# Patient Record
Sex: Female | Born: 1980
Health system: Southern US, Community
[De-identification: ages and names within clinical notes are randomized; demographics above are authoritative.]

## PROBLEM LIST (undated history)

## (undated) DIAGNOSIS — R87629 Unspecified abnormal cytological findings in specimens from vagina: Secondary | ICD-10-CM

## (undated) DIAGNOSIS — K219 Gastro-esophageal reflux disease without esophagitis: Secondary | ICD-10-CM

## (undated) DIAGNOSIS — L719 Rosacea, unspecified: Secondary | ICD-10-CM

## (undated) DIAGNOSIS — G4730 Sleep apnea, unspecified: Secondary | ICD-10-CM

## (undated) DIAGNOSIS — E785 Hyperlipidemia, unspecified: Secondary | ICD-10-CM

## (undated) DIAGNOSIS — O09529 Supervision of elderly multigravida, unspecified trimester: Secondary | ICD-10-CM

## (undated) HISTORY — DX: Rosacea, unspecified: L71.9

## (undated) HISTORY — DX: Hyperlipidemia, unspecified: E78.5

## (undated) HISTORY — DX: Gastro-esophageal reflux disease without esophagitis: K21.9

## (undated) HISTORY — PX: OTHER SURGICAL HISTORY: SHX169

## (undated) HISTORY — PX: WISDOM TOOTH EXTRACTION: SHX21

## (undated) HISTORY — DX: Unspecified abnormal cytological findings in specimens from vagina: R87.629

## (undated) HISTORY — PX: COLPOSCOPY: SHX161

---

## 2016-03-23 DIAGNOSIS — Z713 Dietary counseling and surveillance: Secondary | ICD-10-CM | POA: Diagnosis not present

## 2016-04-11 DIAGNOSIS — K080 Exfoliation of teeth due to systemic causes: Secondary | ICD-10-CM | POA: Diagnosis not present

## 2016-05-24 DIAGNOSIS — Z01419 Encounter for gynecological examination (general) (routine) without abnormal findings: Secondary | ICD-10-CM | POA: Diagnosis not present

## 2016-05-24 DIAGNOSIS — Z6827 Body mass index (BMI) 27.0-27.9, adult: Secondary | ICD-10-CM | POA: Diagnosis not present

## 2016-05-24 DIAGNOSIS — Z1151 Encounter for screening for human papillomavirus (HPV): Secondary | ICD-10-CM | POA: Diagnosis not present

## 2016-06-20 DIAGNOSIS — Z3200 Encounter for pregnancy test, result unknown: Secondary | ICD-10-CM | POA: Diagnosis not present

## 2016-06-20 DIAGNOSIS — B977 Papillomavirus as the cause of diseases classified elsewhere: Secondary | ICD-10-CM | POA: Diagnosis not present

## 2016-07-12 DIAGNOSIS — Z3201 Encounter for pregnancy test, result positive: Secondary | ICD-10-CM | POA: Diagnosis not present

## 2016-07-17 NOTE — L&D Delivery Note (Signed)
Cesarean Section Procedure Note   Samantha CaoStephanie Strohmeier  03/17/2017 - 03/18/2017  Indications: arrest of descent   Pre-operative Diagnosis: iup at 39.6, prolonged rom, arrest of descent  Procedure: primary low transverse c-section Post-operative Diagnosis: Same   Surgeon: Surgeon(s) and Role:    * Allyson Tineo, Candace GallusSusan E, MD - Primary   Assistants: Arlan Organaniela Paul, CNM  Anesthesia: epidural   Procedure Details:  The patient was found to have arrest of descent and plan made for c-secion. The risks, benefits, complications, treatment options, and expected outcomes were discussed with the patient. The patient concurred with the proposed plan, giving informed consent. identified as Samantha CaoStephanie Hilliker and the procedure verified as C-Section Delivery. A Time Out was held and the above information confirmed.  After anesthesia was dosed for procedure, the patient was draped and prepped in the usual sterile manner. A transverse incision was made and carried down through the subcutaneous tissue to the fascia. Fascial incision was made and extended transversely with the mayo scissors. The fascia was separated from the underlying rectus tissue superiorly and inferiorly bluntly and with mayo scissors. The peritoneum was identified and entered bluntly. Peritoneal incision was extended longitudinally. The bladder blade was then placed and the utero-vesical peritoneal reflection was incised transversely and the bladder flap was bluntly freed from the lower uterine segment.  Uterine incision was then made and fetal head was then flexed and gently advanced toward the incision.  This was done without difficulty and the head was then delivered.  The right shoulder was then delivered followed quickly by the abdomen.  The infant was then bulb suctioned and then the cord was clamped and cut. The placenta was removed Intact and manually.  Placenta appeared normal.   The uterus was exteriorized and cleared of clot and debris.  The uterine  outline, tubes and ovaries appeared normal}. The uterine incision was closed with running locked sutures of   0 monocryl.   Hemostasis was observed.  An embrocating layer was then performed with 0 vicryl. Lavage was carried out until clear. The fascia was then reapproximated with running sutures of 0Vicryl. The subcuticular closure was performed using 3-0Vicryl. The skin was closed with 4-0Vicryl.   Instrument, sponge, and needle counts were correct prior the abdominal closure and were correct at the conclusion of the case.    Findings: viable female infant; apgars 9/9, normal uterus; nml tubs/ovaries wnl   Estimated Blood Loss: 830ml  Total IV Fluids: 2400ml   Urine Output: 150ml  Specimens: none   Complications: no complications  Disposition: PACU - hemodynamically stable.   Maternal Condition: stable   Baby condition / location:  Couplet care / Skin to Skin  Attending Attestation: I was present and scrubbed for the entire procedure.   Signed: Surgeon(s): Amado NashAlmquist, Candace GallusSusan E, MD

## 2016-07-28 DIAGNOSIS — Z3201 Encounter for pregnancy test, result positive: Secondary | ICD-10-CM | POA: Diagnosis not present

## 2016-08-21 DIAGNOSIS — Z3491 Encounter for supervision of normal pregnancy, unspecified, first trimester: Secondary | ICD-10-CM | POA: Diagnosis not present

## 2016-08-21 DIAGNOSIS — Z3689 Encounter for other specified antenatal screening: Secondary | ICD-10-CM | POA: Diagnosis not present

## 2016-08-21 DIAGNOSIS — Z3A10 10 weeks gestation of pregnancy: Secondary | ICD-10-CM | POA: Diagnosis not present

## 2016-08-21 DIAGNOSIS — Z36 Encounter for antenatal screening for chromosomal anomalies: Secondary | ICD-10-CM | POA: Diagnosis not present

## 2016-08-21 DIAGNOSIS — O09511 Supervision of elderly primigravida, first trimester: Secondary | ICD-10-CM | POA: Diagnosis not present

## 2016-08-21 LAB — OB RESULTS CONSOLE GC/CHLAMYDIA
Chlamydia: NEGATIVE
Gonorrhea: NEGATIVE

## 2016-08-21 LAB — OB RESULTS CONSOLE RUBELLA ANTIBODY, IGM: Rubella: IMMUNE

## 2016-08-21 LAB — OB RESULTS CONSOLE ABO/RH: RH Type: POSITIVE

## 2016-08-21 LAB — OB RESULTS CONSOLE ANTIBODY SCREEN: ANTIBODY SCREEN: NEGATIVE

## 2016-08-21 LAB — OB RESULTS CONSOLE HIV ANTIBODY (ROUTINE TESTING): HIV: NONREACTIVE

## 2016-08-21 LAB — OB RESULTS CONSOLE HEPATITIS B SURFACE ANTIGEN: HEP B S AG: NEGATIVE

## 2016-08-21 LAB — OB RESULTS CONSOLE RPR: RPR: NONREACTIVE

## 2016-09-06 DIAGNOSIS — Z3682 Encounter for antenatal screening for nuchal translucency: Secondary | ICD-10-CM | POA: Diagnosis not present

## 2016-10-02 DIAGNOSIS — Z3A16 16 weeks gestation of pregnancy: Secondary | ICD-10-CM | POA: Diagnosis not present

## 2016-10-02 DIAGNOSIS — O09512 Supervision of elderly primigravida, second trimester: Secondary | ICD-10-CM | POA: Diagnosis not present

## 2016-10-02 DIAGNOSIS — Z361 Encounter for antenatal screening for raised alphafetoprotein level: Secondary | ICD-10-CM | POA: Diagnosis not present

## 2016-10-23 DIAGNOSIS — Z3A19 19 weeks gestation of pregnancy: Secondary | ICD-10-CM | POA: Diagnosis not present

## 2016-10-23 DIAGNOSIS — Z363 Encounter for antenatal screening for malformations: Secondary | ICD-10-CM | POA: Diagnosis not present

## 2016-10-23 DIAGNOSIS — O09512 Supervision of elderly primigravida, second trimester: Secondary | ICD-10-CM | POA: Diagnosis not present

## 2016-11-08 DIAGNOSIS — Z3A21 21 weeks gestation of pregnancy: Secondary | ICD-10-CM | POA: Diagnosis not present

## 2016-11-08 DIAGNOSIS — O09512 Supervision of elderly primigravida, second trimester: Secondary | ICD-10-CM | POA: Diagnosis not present

## 2016-12-05 DIAGNOSIS — O09512 Supervision of elderly primigravida, second trimester: Secondary | ICD-10-CM | POA: Diagnosis not present

## 2016-12-05 DIAGNOSIS — Z3A25 25 weeks gestation of pregnancy: Secondary | ICD-10-CM | POA: Diagnosis not present

## 2016-12-13 DIAGNOSIS — K080 Exfoliation of teeth due to systemic causes: Secondary | ICD-10-CM | POA: Diagnosis not present

## 2016-12-28 DIAGNOSIS — O4403 Placenta previa specified as without hemorrhage, third trimester: Secondary | ICD-10-CM | POA: Diagnosis not present

## 2016-12-28 DIAGNOSIS — Z23 Encounter for immunization: Secondary | ICD-10-CM | POA: Diagnosis not present

## 2016-12-28 DIAGNOSIS — Z3689 Encounter for other specified antenatal screening: Secondary | ICD-10-CM | POA: Diagnosis not present

## 2016-12-28 DIAGNOSIS — Z3A28 28 weeks gestation of pregnancy: Secondary | ICD-10-CM | POA: Diagnosis not present

## 2017-01-10 DIAGNOSIS — Z3A30 30 weeks gestation of pregnancy: Secondary | ICD-10-CM | POA: Diagnosis not present

## 2017-01-10 DIAGNOSIS — O4403 Placenta previa specified as without hemorrhage, third trimester: Secondary | ICD-10-CM | POA: Diagnosis not present

## 2017-01-26 ENCOUNTER — Ambulatory Visit (INDEPENDENT_AMBULATORY_CARE_PROVIDER_SITE_OTHER): Payer: Self-pay | Admitting: Pediatrics

## 2017-01-26 DIAGNOSIS — Z7681 Expectant parent(s) prebirth pediatrician visit: Secondary | ICD-10-CM

## 2017-01-30 DIAGNOSIS — Z3686 Encounter for antenatal screening for cervical length: Secondary | ICD-10-CM | POA: Diagnosis not present

## 2017-01-30 DIAGNOSIS — O4443 Low lying placenta NOS or without hemorrhage, third trimester: Secondary | ICD-10-CM | POA: Diagnosis not present

## 2017-01-30 DIAGNOSIS — Z3A33 33 weeks gestation of pregnancy: Secondary | ICD-10-CM | POA: Diagnosis not present

## 2017-01-30 NOTE — Progress Notes (Signed)
Prenatal counseling for impending newborn done-- 1st child, currently 32wks, placental previa monitoring, good prenatal care Z76.81

## 2017-02-14 DIAGNOSIS — Z3685 Encounter for antenatal screening for Streptococcus B: Secondary | ICD-10-CM | POA: Diagnosis not present

## 2017-02-14 DIAGNOSIS — Z3A35 35 weeks gestation of pregnancy: Secondary | ICD-10-CM | POA: Diagnosis not present

## 2017-02-14 DIAGNOSIS — O4443 Low lying placenta NOS or without hemorrhage, third trimester: Secondary | ICD-10-CM | POA: Diagnosis not present

## 2017-02-14 LAB — OB RESULTS CONSOLE GBS: GBS: NEGATIVE

## 2017-02-20 DIAGNOSIS — Z3A36 36 weeks gestation of pregnancy: Secondary | ICD-10-CM | POA: Diagnosis not present

## 2017-02-20 DIAGNOSIS — Z3686 Encounter for antenatal screening for cervical length: Secondary | ICD-10-CM | POA: Diagnosis not present

## 2017-02-20 DIAGNOSIS — O4443 Low lying placenta NOS or without hemorrhage, third trimester: Secondary | ICD-10-CM | POA: Diagnosis not present

## 2017-02-20 DIAGNOSIS — O09513 Supervision of elderly primigravida, third trimester: Secondary | ICD-10-CM | POA: Diagnosis not present

## 2017-02-27 DIAGNOSIS — O09513 Supervision of elderly primigravida, third trimester: Secondary | ICD-10-CM | POA: Diagnosis not present

## 2017-02-27 DIAGNOSIS — Z3A36 36 weeks gestation of pregnancy: Secondary | ICD-10-CM | POA: Diagnosis not present

## 2017-03-05 DIAGNOSIS — Z3A38 38 weeks gestation of pregnancy: Secondary | ICD-10-CM | POA: Diagnosis not present

## 2017-03-05 DIAGNOSIS — O09513 Supervision of elderly primigravida, third trimester: Secondary | ICD-10-CM | POA: Diagnosis not present

## 2017-03-05 DIAGNOSIS — Z3686 Encounter for antenatal screening for cervical length: Secondary | ICD-10-CM | POA: Diagnosis not present

## 2017-03-12 ENCOUNTER — Other Ambulatory Visit: Payer: Self-pay | Admitting: Obstetrics

## 2017-03-12 DIAGNOSIS — Z3A39 39 weeks gestation of pregnancy: Secondary | ICD-10-CM | POA: Diagnosis not present

## 2017-03-12 DIAGNOSIS — O09513 Supervision of elderly primigravida, third trimester: Secondary | ICD-10-CM | POA: Diagnosis not present

## 2017-03-13 ENCOUNTER — Telehealth (HOSPITAL_COMMUNITY): Payer: Self-pay | Admitting: *Deleted

## 2017-03-13 ENCOUNTER — Encounter (HOSPITAL_COMMUNITY): Payer: Self-pay | Admitting: *Deleted

## 2017-03-13 NOTE — Telephone Encounter (Signed)
Preadmission screen  

## 2017-03-17 ENCOUNTER — Encounter (HOSPITAL_COMMUNITY): Payer: Self-pay | Admitting: *Deleted

## 2017-03-17 ENCOUNTER — Inpatient Hospital Stay (HOSPITAL_COMMUNITY)
Admission: AD | Admit: 2017-03-17 | Discharge: 2017-03-20 | DRG: 765 | Disposition: A | Discharge status: Home / Self Care | Payer: Federal, State, Local not specified - PPO | Source: Ambulatory Visit | Attending: Obstetrics | Admitting: Obstetrics

## 2017-03-17 ENCOUNTER — Inpatient Hospital Stay (HOSPITAL_COMMUNITY): Payer: Federal, State, Local not specified - PPO | Admitting: Anesthesiology

## 2017-03-17 DIAGNOSIS — O9081 Anemia of the puerperium: Secondary | ICD-10-CM | POA: Diagnosis not present

## 2017-03-17 DIAGNOSIS — O324XX0 Maternal care for high head at term, not applicable or unspecified: Principal | ICD-10-CM | POA: Diagnosis present

## 2017-03-17 DIAGNOSIS — O9962 Diseases of the digestive system complicating childbirth: Secondary | ICD-10-CM | POA: Diagnosis not present

## 2017-03-17 DIAGNOSIS — O3663X0 Maternal care for excessive fetal growth, third trimester, not applicable or unspecified: Secondary | ICD-10-CM | POA: Diagnosis present

## 2017-03-17 DIAGNOSIS — Z3A39 39 weeks gestation of pregnancy: Secondary | ICD-10-CM

## 2017-03-17 DIAGNOSIS — D62 Acute posthemorrhagic anemia: Secondary | ICD-10-CM | POA: Diagnosis not present

## 2017-03-17 DIAGNOSIS — Z3490 Encounter for supervision of normal pregnancy, unspecified, unspecified trimester: Secondary | ICD-10-CM | POA: Diagnosis present

## 2017-03-17 DIAGNOSIS — Z3A38 38 weeks gestation of pregnancy: Secondary | ICD-10-CM | POA: Diagnosis not present

## 2017-03-17 DIAGNOSIS — O99019 Anemia complicating pregnancy, unspecified trimester: Secondary | ICD-10-CM | POA: Diagnosis not present

## 2017-03-17 DIAGNOSIS — Z3A00 Weeks of gestation of pregnancy not specified: Secondary | ICD-10-CM | POA: Diagnosis not present

## 2017-03-17 DIAGNOSIS — K219 Gastro-esophageal reflux disease without esophagitis: Secondary | ICD-10-CM | POA: Diagnosis present

## 2017-03-17 DIAGNOSIS — O26893 Other specified pregnancy related conditions, third trimester: Secondary | ICD-10-CM | POA: Diagnosis not present

## 2017-03-17 HISTORY — DX: Sleep apnea, unspecified: G47.30

## 2017-03-17 LAB — CBC
HCT: 33.8 % — ABNORMAL LOW (ref 36.0–46.0)
HEMATOCRIT: 30.8 % — AB (ref 36.0–46.0)
HEMOGLOBIN: 12.1 g/dL (ref 12.0–15.0)
Hemoglobin: 11.1 g/dL — ABNORMAL LOW (ref 12.0–15.0)
MCH: 31.8 pg (ref 26.0–34.0)
MCH: 32.2 pg (ref 26.0–34.0)
MCHC: 35.8 g/dL (ref 30.0–36.0)
MCHC: 36 g/dL (ref 30.0–36.0)
MCV: 88.9 fL (ref 78.0–100.0)
MCV: 89.3 fL (ref 78.0–100.0)
PLATELETS: 202 10*3/uL (ref 150–400)
Platelets: 213 10*3/uL (ref 150–400)
RBC: 3.8 MIL/uL — ABNORMAL LOW (ref 3.87–5.11)
RBC: 3.45 MIL/uL — ABNORMAL LOW (ref 3.87–5.11)
RDW: 13.5 % (ref 11.5–15.5)
RDW: 13.4 % (ref 11.5–15.5)
WBC: 8.2 10*3/uL (ref 4.0–10.5)
WBC: 9.2 10*3/uL (ref 4.0–10.5)

## 2017-03-17 LAB — COMPREHENSIVE METABOLIC PANEL
ALBUMIN: 2.9 g/dL — AB (ref 3.5–5.0)
ALK PHOS: 195 U/L — AB (ref 38–126)
ALT: 12 U/L — ABNORMAL LOW (ref 14–54)
ANION GAP: 9 (ref 5–15)
AST: 21 U/L (ref 15–41)
BUN: 12 mg/dL (ref 6–20)
CO2: 20 mmol/L — AB (ref 22–32)
Calcium: 9.1 mg/dL (ref 8.9–10.3)
Chloride: 105 mmol/L (ref 101–111)
Creatinine, Ser: 0.65 mg/dL (ref 0.44–1.00)
GFR calc Af Amer: >60 mL/min (ref >60)
GFR calc non Af Amer: >60 mL/min (ref >60)
GLUCOSE: 115 mg/dL — AB (ref 65–99)
POTASSIUM: 4 mmol/L (ref 3.5–5.1)
SODIUM: 134 mmol/L — AB (ref 135–145)
Total Bilirubin: 0.4 mg/dL (ref 0.3–1.2)
Total Protein: 6.7 g/dL (ref 6.5–8.1)

## 2017-03-17 LAB — PROTEIN / CREATININE RATIO, URINE
Creatinine, Urine: 310 mg/dL
PROTEIN CREATININE RATIO: 0.08 mg/mg{creat} (ref 0.00–0.15)
Total Protein, Urine: 25 mg/dL

## 2017-03-17 LAB — AMNISURE RUPTURE OF MEMBRANE (ROM) NOT AT ARMC: AMNISURE: POSITIVE

## 2017-03-17 LAB — ABO/RH: ABO/RH(D): O POS

## 2017-03-17 LAB — TYPE AND SCREEN
ABO/RH(D): O POS
ANTIBODY SCREEN: NEGATIVE

## 2017-03-17 MED ORDER — SOD CITRATE-CITRIC ACID 500-334 MG/5ML PO SOLN: 30.0000 mL | ORAL | Status: DC | PRN

## 2017-03-17 MED ORDER — OXYCODONE-ACETAMINOPHEN 5-325 MG PO TABS: 1.0000 | ORAL_TABLET | ORAL | Status: DC | PRN

## 2017-03-17 MED ORDER — LACTATED RINGERS IV SOLN: INTRAVENOUS | Status: DC

## 2017-03-17 MED ORDER — FLEET ENEMA 7-19 GM/118ML RE ENEM: 1.0000 | ENEMA | RECTAL | Status: DC | PRN

## 2017-03-17 MED ORDER — MISOPROSTOL 25 MCG QUARTER TABLET: 25.0000 ug | ORAL_TABLET | ORAL | Status: DC | PRN

## 2017-03-17 MED ORDER — ACETAMINOPHEN 325 MG PO TABS: 650.0000 mg | ORAL_TABLET | ORAL | Status: DC | PRN

## 2017-03-17 MED ORDER — OXYCODONE-ACETAMINOPHEN 5-325 MG PO TABS: 2.0000 | ORAL_TABLET | ORAL | Status: DC | PRN

## 2017-03-17 MED ORDER — LIDOCAINE HCL (PF) 1 % IJ SOLN
30.0000 mL | INTRAMUSCULAR | Status: DC | PRN
  Filled 2017-03-17: qty 30

## 2017-03-17 MED ORDER — TERBUTALINE SULFATE 1 MG/ML IJ SOLN: 0.2500 mg | Freq: Once | INTRAMUSCULAR | Status: DC | PRN

## 2017-03-17 MED ORDER — OXYTOCIN 40 UNITS IN LACTATED RINGERS INFUSION - SIMPLE MED
2.5000 [IU]/h | INTRAVENOUS | Status: DC
  Filled 2017-03-17 (×2): qty 1000

## 2017-03-17 MED ORDER — EPHEDRINE 5 MG/ML INJ: 10.0000 mg | INTRAVENOUS | Status: DC | PRN

## 2017-03-17 MED ORDER — LACTATED RINGERS IV SOLN: 500.0000 mL | INTRAVENOUS | Status: DC | PRN

## 2017-03-17 MED ORDER — PHENYLEPHRINE 40 MCG/ML (10ML) SYRINGE FOR IV PUSH (FOR BLOOD PRESSURE SUPPORT): 80.0000 ug | PREFILLED_SYRINGE | INTRAVENOUS | Status: DC | PRN

## 2017-03-17 MED ORDER — LACTATED RINGERS IV SOLN
INTRAVENOUS | Status: DC
  Administered 2017-03-17 (×2): via INTRAVENOUS

## 2017-03-17 MED ORDER — OXYTOCIN 40 UNITS IN LACTATED RINGERS INFUSION - SIMPLE MED
1.0000 m[IU]/min | INTRAVENOUS | Status: DC
  Administered 2017-03-17: 12 m[IU]/min via INTRAVENOUS
  Administered 2017-03-17: 10 m[IU]/min via INTRAVENOUS
  Administered 2017-03-17: 2 m[IU]/min via INTRAVENOUS
  Administered 2017-03-17: 14 m[IU]/min via INTRAVENOUS

## 2017-03-17 MED ORDER — LIDOCAINE HCL (PF) 1 % IJ SOLN
INTRAMUSCULAR | Status: DC | PRN
  Administered 2017-03-17 (×2): 5 mL via EPIDURAL

## 2017-03-17 MED ORDER — OXYTOCIN BOLUS FROM INFUSION: 500.0000 mL | Freq: Once | INTRAVENOUS | Status: DC

## 2017-03-17 MED ORDER — DIPHENHYDRAMINE HCL 50 MG/ML IJ SOLN: 12.5000 mg | INTRAMUSCULAR | Status: DC | PRN

## 2017-03-17 MED ORDER — PHENYLEPHRINE 40 MCG/ML (10ML) SYRINGE FOR IV PUSH (FOR BLOOD PRESSURE SUPPORT)
80.0000 ug | PREFILLED_SYRINGE | INTRAVENOUS | Status: DC | PRN
  Filled 2017-03-17: qty 10

## 2017-03-17 MED ORDER — SOD CITRATE-CITRIC ACID 500-334 MG/5ML PO SOLN
30.0000 mL | ORAL | Status: DC | PRN
  Administered 2017-03-17 – 2017-03-18 (×4): 30 mL via ORAL
  Filled 2017-03-17 (×4): qty 15

## 2017-03-17 MED ORDER — LACTATED RINGERS IV SOLN
500.0000 mL | Freq: Once | INTRAVENOUS | Status: AC
  Administered 2017-03-17: 500 mL via INTRAVENOUS

## 2017-03-17 MED ORDER — FENTANYL 2.5 MCG/ML BUPIVACAINE 1/10 % EPIDURAL INFUSION (WH - ANES)
14.0000 mL/h | INTRAMUSCULAR | Status: DC | PRN
  Administered 2017-03-17 (×2): 14 mL/h via EPIDURAL
  Filled 2017-03-17 (×2): qty 100

## 2017-03-17 MED ORDER — ONDANSETRON HCL 4 MG/2ML IJ SOLN: 4.0000 mg | Freq: Four times a day (QID) | INTRAMUSCULAR | Status: DC | PRN

## 2017-03-17 MED ORDER — OXYTOCIN 40 UNITS IN LACTATED RINGERS INFUSION - SIMPLE MED: 1.0000 m[IU]/min | INTRAVENOUS | Status: DC

## 2017-03-17 NOTE — H&P (Signed)
Samantha CaoStephanie Schmidt is a 36 y.o. female G1P0 at 6039.5 by 6 wk u/s presenting for rom this am around 0800.  She says that there was no gush, but has been leaking clear fluid.  Had some mild contractions overnight and feels that more frequent and regular over last hour or so.  Denies any vaginal bleeding.  +FM.  Antepartum course complicated with AMA, nml genetic screening.  Also complicated with GERD and controlled with Zantac.  Course also complicated by placenta previa that resolved and has been stable at 2.2cm away from cervical os, anterior.  Last u/s was 2 wks ago with efw 7lb 8 oz (efw 66%), nml afi.  HR hpv pap x2 yrs, colpo neg 12/17,  repeat pap pp planned. Pt had mild range bps on admission - labs done and normal. Pt denies h/a, vision changes, RUQ pain. No h/o elevated bp.   [facility] at [facility] since 10 wks History OB History    Gravida Para Term Preterm AB Living   1             SAB TAB Ectopic Multiple Live Births                 Past Medical History:  Diagnosis Date  . GERD (gastroesophageal reflux disease)   . Rosacea   . Sleep apnea   . Sleep apnea   . Vaginal Pap smear, abnormal    Past Surgical History:  Procedure Laterality Date  . arm surgery    . COLPOSCOPY    . WISDOM TOOTH EXTRACTION     Family History: family history includes Hyperlipidemia in her father and mother; Hypertension in her father and mother. Social History:  reports that she has never smoked. She has never used smokeless tobacco. She reports that she does not drink alcohol or use drugs.    ROS  See above, otherwise neg  Dilation: 3 Effacement (%): 80 Station: -3 Exam by:: Dr. Amado NashAlmquist Blood pressure (!) 146/91, pulse 98, temperature 97.9 F (36.6 C), temperature source Oral, resp. rate 18, height 5\' 9"  (1.753 m), weight 213 lb (96.6 kg). Exam Physical Exam  Prenatal labs: ABO, Rh: --/--/O POS (09/01 1158) Antibody: NEG (09/01 1158) Rubella: !Error!immune RPR: Nonreactive (02/05 0000)   HBsAg: Negative (02/05 0000)  HIV: Non-reactive (02/05 0000)  GBS: Negative (08/01 0000)  1 hr Glucola - passed Genetic screening - nml NT/ultrasound Anatomy US: normal anatomy; placenta previa, now 2.2cm away from os, anterior  Physical Exam:  A&O x 3 HEENT - wnl Lungs - ctab CV - rrr Abdo - soft, nt, gravid; efw 7.5-8lbs Extr : no edema, nt bilat LE Pelvic : 3/80/-3; clear fluid noted during exam; cephalic  FHT: 120s, nml variability, +accels, one early decel with subsequent accels TOCO: irregular q 5  CBC    Component Value Date/Time   WBC 8.2 03/17/2017 1158   RBC 3.80 (L) 03/17/2017 1158   HGB 12.1 03/17/2017 1158   HCT 33.8 (L) 03/17/2017 1158   PLT 213 03/17/2017 1158   MCV 88.9 03/17/2017 1158   MCH 31.8 03/17/2017 1158   MCHC 35.8 03/17/2017 1158   RDW 13.5 03/17/2017 1158   CMP Latest Ref Rng & Units 03/17/2017  Glucose 65 - 99 mg/dL 161(W115(H)  BUN 6 - 20 mg/dL 12  Creatinine 9.600.44 - 4.541.00 mg/dL 0.980.65  Sodium 119135 - 147145 mmol/L 134(L)  Potassium 3.5 - 5.1 mmol/L 4.0  Chloride 101 - 111 mmol/L 105  CO2 22 - 32 mmol/L  20(L)  Calcium 8.9 - 10.3 mg/dL 9.1  Total Protein 6.5 - 8.1 g/dL 6.7  Total Bilirubin 0.3 - 1.2 mg/dL 0.4  Alkaline Phos 38 - 126 U/L 195(H)  AST 15 - 41 U/L 21  ALT 14 - 54 U/L 12(L)    Assessment/Plan: G1 at 39.5 1.  SROM - admit for delivery; will obs for 2 hours and re-evaluate for cervical change - if no change, begin pitocin augmentation; I have reviewed this with pt and husband and agree with plan.  Plan svd 2. Fetal status reassuring - continue efm 3. gbs neg 4. GHTN - labs wnl and pt asymptomatic; pt does plan epidural  5. ama - nml genetic testing 6. Rh pos, rubella  Immune 7. H/o placenta previa - now anterior and 2.2cm from os  Vick Frees 03/17/2017, 2:26 PM

## 2017-03-17 NOTE — Anesthesia Pain Management Evaluation Note (Signed)
  CRNA Pain Management Visit Note  Patient: Samantha Schmidt, 36 y.o., female  "Hello I am a member of the anesthesia team at [facility]. We have an anesthesia team available at all times to provide care throughout the hospital, including epidural management and anesthesia for C-section. I don't know your plan for the delivery whether it a natural birth, water birth, IV sedation, nitrous supplementation, doula or epidural, but we want to meet your pain goals."   1.Was your pain managed to your expectations on prior hospitalizations?   No previous hospitalizations.   2.What is your expectation for pain management during this hospitalization?     Epidural  3.How can we help you reach that goal? Standby  Record the patient's initial score and the patient's pain goal.   Pain: 3  Pain Goal: 7 [facility] wants you to be able to say your pain was always managed very well.  Samantha Schmidt 03/17/2017

## 2017-03-17 NOTE — Anesthesia Procedure Notes (Signed)
Epidural

## 2017-03-17 NOTE — Progress Notes (Signed)
S/p epidural, feeling occ ctx in back - about a 3/10  Patient Vitals for the past 24 hrs:  BP Temp Temp src Pulse Resp SpO2 Height Weight  03/17/17 1930 139/85 - - 95 18 - - -  03/17/17 1915 135/82 97.9 F (36.6 C) Oral 91 18 - - -  03/17/17 1910 (!) 147/86 - - 86 18 - - -  03/17/17 1905 136/89 - - 85 18 - - -  03/17/17 1900 (!) 144/85 - - 87 18 - - -  03/17/17 1855 (!) 144/85 - - 93 18 - - -  03/17/17 1852 (!) 156/108 - - 90 18 - - -  03/17/17 1847 (!) 134/93 - - (!) 104 18 - - -  03/17/17 1845 (!) 149/94 - - 92 18 100 % - -  03/17/17 1830 131/79 - - 88 18 - - -  03/17/17 1800 (!) 151/101 - - 94 18 - - -  03/17/17 1743 - 98.2 F (36.8 C) Oral - - - - -  03/17/17 1736 (!) 144/84 - - 91 18 - - -  03/17/17 1700 137/79 - - 95 18 - - -  03/17/17 1630 (!) 137/93 - - 82 18 - - -  03/17/17 1625 (!) 147/104 98 F (36.7 C) Oral 80 18 - - -  03/17/17 1537 (!) 143/97 - - 95 18 - - -  03/17/17 1434 (!) 128/99 98.1 F (36.7 C) Oral (!) 105 18 - - -  03/17/17 1330 (!) 146/91 - - 98 18 - - -  03/17/17 1309 (!) 146/102 97.9 F (36.6 C) Oral 98 20 - 5\' 9"  (1.753 m) 213 lb (96.6 kg)  03/17/17 1246 125/90 - - (!) 102 - - - -  03/17/17 1231 (!) 135/93 - - 96 - - - -  03/17/17 1215 123/89 - - (!) 114 - - - -  03/17/17 1210 130/90 - - (!) 103 - - - -  03/17/17 1146 121/82 - - (!) 107 - - - -  03/17/17 1135 134/82 - - 98 - - - -  03/17/17 1125 (!) 145/98 97.7 F (36.5 C) Oral (!) 113 20 - - -    A&ox3 nml respirations Abd: soft, nt, gravid Cx: 6/95/-2, cephalic LE: +1edema, nt bilat   FHT: 120s, nml variability, +accels, no decels Toco: q 2-3  A/P: iup at 39.5 1. srom - continue pitocin augmentation; plan svd; srom 13 hours ago 2. Fetal status reassuring 3.  Rh pos 4. gbs neg

## 2017-03-17 NOTE — MAU Note (Signed)
Pt started ? Leaking last night, had more leaking this a.m., clear mucus, pink @ times.  Leaking down her leg right now.  Also having cramping & pelvic pressure.  Denies bleeding, reports good fetal movement.

## 2017-03-17 NOTE — Anesthesia Preprocedure Evaluation (Signed)
Anesthesia Evaluation  Patient identified by MRN, date of birth, ID band Patient awake    Reviewed: Allergy & Precautions, H&P , NPO status , Patient's Chart, lab work & pertinent test results, reviewed documented beta blocker date and time   Airway Mallampati: II  TM Distance: >3 FB Neck ROM: full    Dental no notable dental hx.    Pulmonary neg pulmonary ROS,    Pulmonary exam normal breath sounds clear to auscultation       Cardiovascular negative cardio ROS Normal cardiovascular exam Rhythm:regular Rate:Normal     Neuro/Psych negative neurological ROS  negative psych ROS   GI/Hepatic negative GI ROS, Neg liver ROS, GERD  ,  Endo/Other  negative endocrine ROS  Renal/GU negative Renal ROS  negative genitourinary   Musculoskeletal   Abdominal   Peds  Hematology negative hematology ROS (+)   Anesthesia Other Findings   Reproductive/Obstetrics (+) Pregnancy                             Anesthesia Physical Anesthesia Plan  ASA: II  Anesthesia Plan: Epidural   Post-op Pain Management:    Induction:   PONV Risk Score and Plan:   Airway Management Planned:   Additional Equipment:   Intra-op Plan:   Post-operative Plan:   Informed Consent: I have reviewed the patients History and Physical, chart, labs and discussed the procedure including the risks, benefits and alternatives for the proposed anesthesia with the patient or authorized representative who has indicated his/her understanding and acceptance.     Plan Discussed with:   Anesthesia Plan Comments:         Anesthesia Quick Evaluation

## 2017-03-17 NOTE — Anesthesia Procedure Notes (Signed)
Epidural Patient location during procedure: OB Start time: 03/17/2017 6:35 PM End time: 03/17/2017 6:40 PM  Staffing Anesthesiologist: Laniya Friedl  Preanesthetic Checklist Completed: patient identified, site marked, surgical consent, pre-op evaluation, timeout performed, IV checked, risks and benefits discussed and monitors and equipment checked  Epidural Patient position: sitting Prep: site prepped and draped and DuraPrep Patient monitoring: continuous pulse ox and blood pressure Approach: midline Location: L4-L5 Injection technique: LOR air  Needle:  Needle type: Tuohy  Needle gauge: 17 G Needle length: 9 cm and 9 Needle insertion depth: 6 cm Catheter type: closed end flexible Catheter size: 19 Gauge Catheter at skin depth: 11 cm Test dose: negative  Assessment Events: blood not aspirated, injection not painful, no injection resistance, negative IV test and no paresthesia

## 2017-03-18 ENCOUNTER — Encounter (HOSPITAL_COMMUNITY): Payer: Self-pay | Admitting: Obstetrics and Gynecology

## 2017-03-18 ENCOUNTER — Encounter (HOSPITAL_COMMUNITY)
Admission: AD | Disposition: A | Discharge status: Home / Self Care | Payer: Self-pay | Source: Ambulatory Visit | Attending: Obstetrics

## 2017-03-18 DIAGNOSIS — Z3A38 38 weeks gestation of pregnancy: Secondary | ICD-10-CM | POA: Diagnosis not present

## 2017-03-18 DIAGNOSIS — O99019 Anemia complicating pregnancy, unspecified trimester: Secondary | ICD-10-CM | POA: Diagnosis not present

## 2017-03-18 DIAGNOSIS — Z3A00 Weeks of gestation of pregnancy not specified: Secondary | ICD-10-CM | POA: Diagnosis not present

## 2017-03-18 LAB — RPR: RPR: NONREACTIVE

## 2017-03-18 SURGERY — Surgical Case
Anesthesia: Regional

## 2017-03-18 MED ORDER — MEPERIDINE HCL 25 MG/ML IJ SOLN
INTRAMUSCULAR | Status: AC
  Filled 2017-03-18: qty 1

## 2017-03-18 MED ORDER — SODIUM CHLORIDE 0.9 % IR SOLN
Status: DC | PRN
  Administered 2017-03-18: 500 mL

## 2017-03-18 MED ORDER — DIBUCAINE 1 % RE OINT: 1.0000 "application " | TOPICAL_OINTMENT | RECTAL | Status: DC | PRN

## 2017-03-18 MED ORDER — ACETAMINOPHEN 325 MG PO TABS: 650.0000 mg | ORAL_TABLET | ORAL | Status: DC | PRN

## 2017-03-18 MED ORDER — MEPERIDINE HCL 25 MG/ML IJ SOLN
INTRAMUSCULAR | Status: DC | PRN
  Administered 2017-03-18 (×2): 12.5 mg via INTRAVENOUS

## 2017-03-18 MED ORDER — SCOPOLAMINE 1 MG/3DAYS TD PT72
MEDICATED_PATCH | TRANSDERMAL | Status: AC
  Filled 2017-03-18: qty 1

## 2017-03-18 MED ORDER — STERILE WATER FOR IRRIGATION IR SOLN
Status: DC | PRN
  Administered 2017-03-18: 1000 mL

## 2017-03-18 MED ORDER — MORPHINE SULFATE (PF) 0.5 MG/ML IJ SOLN
INTRAMUSCULAR | Status: AC
  Filled 2017-03-18: qty 10

## 2017-03-18 MED ORDER — MEPERIDINE HCL 25 MG/ML IJ SOLN: 6.2500 mg | INTRAMUSCULAR | Status: DC | PRN

## 2017-03-18 MED ORDER — IBUPROFEN 600 MG PO TABS
600.0000 mg | ORAL_TABLET | Freq: Four times a day (QID) | ORAL | Status: DC
  Administered 2017-03-18 – 2017-03-20 (×9): 600 mg via ORAL
  Filled 2017-03-18 (×9): qty 1

## 2017-03-18 MED ORDER — OXYCODONE-ACETAMINOPHEN 5-325 MG PO TABS
2.0000 | ORAL_TABLET | ORAL | Status: DC | PRN
Start: 2017-03-18 — End: 2017-03-20

## 2017-03-18 MED ORDER — SIMETHICONE 80 MG PO CHEW
80.0000 mg | CHEWABLE_TABLET | Freq: Three times a day (TID) | ORAL | Status: DC
  Administered 2017-03-18 – 2017-03-20 (×7): 80 mg via ORAL
  Filled 2017-03-18 (×7): qty 1

## 2017-03-18 MED ORDER — DEXAMETHASONE SODIUM PHOSPHATE 4 MG/ML IJ SOLN
INTRAMUSCULAR | Status: AC
  Filled 2017-03-18: qty 1

## 2017-03-18 MED ORDER — WITCH HAZEL-GLYCERIN EX PADS: 1.0000 "application " | MEDICATED_PAD | CUTANEOUS | Status: DC | PRN

## 2017-03-18 MED ORDER — COCONUT OIL OIL: 1.0000 "application " | TOPICAL_OIL | Status: DC | PRN

## 2017-03-18 MED ORDER — SENNOSIDES-DOCUSATE SODIUM 8.6-50 MG PO TABS
2.0000 | ORAL_TABLET | ORAL | Status: DC
  Administered 2017-03-18 – 2017-03-20 (×2): 2 via ORAL
  Filled 2017-03-18 (×2): qty 2

## 2017-03-18 MED ORDER — ONDANSETRON HCL 4 MG/2ML IJ SOLN
INTRAMUSCULAR | Status: AC
  Filled 2017-03-18: qty 2

## 2017-03-18 MED ORDER — FENTANYL CITRATE (PF) 100 MCG/2ML IJ SOLN: 25.0000 ug | INTRAMUSCULAR | Status: DC | PRN

## 2017-03-18 MED ORDER — FAMOTIDINE 20 MG PO TABS
20.0000 mg | ORAL_TABLET | Freq: Two times a day (BID) | ORAL | Status: DC
  Administered 2017-03-18 – 2017-03-20 (×4): 20 mg via ORAL
  Filled 2017-03-18 (×5): qty 1

## 2017-03-18 MED ORDER — CEFAZOLIN SODIUM-DEXTROSE 2-3 GM-% IV SOLR
INTRAVENOUS | Status: DC | PRN
  Administered 2017-03-18: 2 g via INTRAVENOUS

## 2017-03-18 MED ORDER — OXYTOCIN 10 UNIT/ML IJ SOLN
INTRAMUSCULAR | Status: AC
  Filled 2017-03-18: qty 4

## 2017-03-18 MED ORDER — SIMETHICONE 80 MG PO CHEW: 80.0000 mg | CHEWABLE_TABLET | ORAL | Status: DC | PRN

## 2017-03-18 MED ORDER — DIPHENHYDRAMINE HCL 25 MG PO CAPS: 25.0000 mg | ORAL_CAPSULE | Freq: Four times a day (QID) | ORAL | Status: DC | PRN

## 2017-03-18 MED ORDER — MORPHINE SULFATE (PF) 0.5 MG/ML IJ SOLN
INTRAMUSCULAR | Status: DC | PRN
  Administered 2017-03-18: 4 mg via EPIDURAL

## 2017-03-18 MED ORDER — DEXAMETHASONE SODIUM PHOSPHATE 4 MG/ML IJ SOLN
INTRAMUSCULAR | Status: DC | PRN
  Administered 2017-03-18: 4 mg via INTRAVENOUS

## 2017-03-18 MED ORDER — LACTATED RINGERS IV SOLN
INTRAVENOUS | Status: DC | PRN
  Administered 2017-03-18 (×3): via INTRAVENOUS

## 2017-03-18 MED ORDER — OXYCODONE-ACETAMINOPHEN 5-325 MG PO TABS
1.0000 | ORAL_TABLET | ORAL | Status: DC | PRN
  Administered 2017-03-19 – 2017-03-20 (×4): 1 via ORAL
  Filled 2017-03-18 (×4): qty 1

## 2017-03-18 MED ORDER — ONDANSETRON HCL 4 MG/2ML IJ SOLN
INTRAMUSCULAR | Status: DC | PRN
  Administered 2017-03-18: 4 mg via INTRAVENOUS

## 2017-03-18 MED ORDER — SOD CITRATE-CITRIC ACID 500-334 MG/5ML PO SOLN: 30.0000 mL | ORAL | Status: DC

## 2017-03-18 MED ORDER — PRENATAL MULTIVITAMIN CH
1.0000 | ORAL_TABLET | Freq: Every day | ORAL | Status: DC
  Administered 2017-03-18 – 2017-03-20 (×3): 1 via ORAL
  Filled 2017-03-18 (×3): qty 1

## 2017-03-18 MED ORDER — LIDOCAINE-EPINEPHRINE (PF) 2 %-1:200000 IJ SOLN
INTRAMUSCULAR | Status: DC | PRN
  Administered 2017-03-18 (×2): 5 mL via INTRADERMAL

## 2017-03-18 MED ORDER — OXYTOCIN 40 UNITS IN LACTATED RINGERS INFUSION - SIMPLE MED: 2.5000 [IU]/h | INTRAVENOUS | Status: DC

## 2017-03-18 MED ORDER — MENTHOL 3 MG MT LOZG: 1.0000 | LOZENGE | OROMUCOSAL | Status: DC | PRN

## 2017-03-18 MED ORDER — ONDANSETRON HCL 4 MG/2ML IJ SOLN: 4.0000 mg | Freq: Once | INTRAMUSCULAR | Status: DC | PRN

## 2017-03-18 MED ORDER — PHENYLEPHRINE HCL 10 MG/ML IJ SOLN
INTRAMUSCULAR | Status: DC | PRN
  Administered 2017-03-18: 80 ug via INTRAVENOUS
  Administered 2017-03-18: 120 ug via INTRAVENOUS
  Administered 2017-03-18: 80 ug via INTRAVENOUS
  Administered 2017-03-18 (×2): 120 ug via INTRAVENOUS
  Administered 2017-03-18: 80 ug via INTRAVENOUS
  Administered 2017-03-18: 120 ug via INTRAVENOUS
  Administered 2017-03-18 (×2): 80 ug via INTRAVENOUS
  Administered 2017-03-18 (×2): 120 ug via INTRAVENOUS
  Administered 2017-03-18 (×2): 80 ug via INTRAVENOUS
  Administered 2017-03-18: 120 ug via INTRAVENOUS

## 2017-03-18 MED ORDER — CEFAZOLIN SODIUM-DEXTROSE 2-4 GM/100ML-% IV SOLN: 2.0000 g | INTRAVENOUS | Status: DC

## 2017-03-18 MED ORDER — LACTATED RINGERS IV SOLN
INTRAVENOUS | Status: DC
  Administered 2017-03-18: 12:00:00 via INTRAVENOUS

## 2017-03-18 MED ORDER — SCOPOLAMINE 1 MG/3DAYS TD PT72
MEDICATED_PATCH | TRANSDERMAL | Status: DC | PRN
  Administered 2017-03-18: 1 via TRANSDERMAL

## 2017-03-18 MED ORDER — PHENYLEPHRINE 40 MCG/ML (10ML) SYRINGE FOR IV PUSH (FOR BLOOD PRESSURE SUPPORT)
PREFILLED_SYRINGE | INTRAVENOUS | Status: AC
  Filled 2017-03-18: qty 30

## 2017-03-18 MED ORDER — CEFAZOLIN SODIUM-DEXTROSE 2-4 GM/100ML-% IV SOLN
INTRAVENOUS | Status: AC
  Filled 2017-03-18: qty 100

## 2017-03-18 MED ORDER — SIMETHICONE 80 MG PO CHEW
80.0000 mg | CHEWABLE_TABLET | ORAL | Status: DC
  Administered 2017-03-18 – 2017-03-20 (×2): 80 mg via ORAL
  Filled 2017-03-18 (×2): qty 1

## 2017-03-18 SURGICAL SUPPLY — 35 items
BENZOIN TINCTURE PRP APPL 2/3 (GAUZE/BANDAGES/DRESSINGS) ×2 IMPLANT
CHLORAPREP W/TINT 26ML (MISCELLANEOUS) ×2 IMPLANT
CLAMP CORD UMBIL (MISCELLANEOUS) IMPLANT
CLOTH BEACON ORANGE TIMEOUT ST (SAFETY) ×2 IMPLANT
CLSR STERI-STRIP ANTIMIC 1/2X4 (GAUZE/BANDAGES/DRESSINGS) ×2 IMPLANT
DRSG OPSITE POSTOP 4X10 (GAUZE/BANDAGES/DRESSINGS) ×2 IMPLANT
ELECT REM PT RETURN 9FT ADLT (ELECTROSURGICAL) ×2
ELECTRODE REM PT RTRN 9FT ADLT (ELECTROSURGICAL) ×1 IMPLANT
EXTRACTOR VACUUM KIWI (MISCELLANEOUS) ×2 IMPLANT
GAUZE SPONGE 4X4 12PLY STRL (GAUZE/BANDAGES/DRESSINGS) ×2 IMPLANT
GLOVE BIOGEL PI IND STRL 6.5 (GLOVE) ×1 IMPLANT
GLOVE BIOGEL PI IND STRL 7.0 (GLOVE) ×2 IMPLANT
GLOVE BIOGEL PI INDICATOR 6.5 (GLOVE) ×1
GLOVE BIOGEL PI INDICATOR 7.0 (GLOVE) ×2
GLOVE ECLIPSE 6.5 STRL STRAW (GLOVE) ×2 IMPLANT
GOWN STRL REUS W/TWL LRG LVL3 (GOWN DISPOSABLE) ×4 IMPLANT
KIT ABG SYR 3ML LUER SLIP (SYRINGE) IMPLANT
NEEDLE HYPO 25X5/8 SAFETYGLIDE (NEEDLE) IMPLANT
NS IRRIG 1000ML POUR BTL (IV SOLUTION) ×2 IMPLANT
PACK C SECTION WH (CUSTOM PROCEDURE TRAY) ×2 IMPLANT
PAD ABD 7.5X8 STRL (GAUZE/BANDAGES/DRESSINGS) ×2 IMPLANT
PAD OB MATERNITY 4.3X12.25 (PERSONAL CARE ITEMS) ×2 IMPLANT
PENCIL SMOKE EVAC W/HOLSTER (ELECTROSURGICAL) ×2 IMPLANT
STRIP CLOSURE SKIN 1/2X4 (GAUZE/BANDAGES/DRESSINGS) IMPLANT
SUT MON AB-0 CT1 36 (SUTURE) ×4 IMPLANT
SUT PDS AB 0 CT1 36 (SUTURE) IMPLANT
SUT PLAIN 2 0 XLH (SUTURE) IMPLANT
SUT VIC AB 0 CT1 36 (SUTURE) ×4 IMPLANT
SUT VIC AB 3-0 CT1 27 (SUTURE) ×1
SUT VIC AB 3-0 CT1 TAPERPNT 27 (SUTURE) ×1 IMPLANT
SUT VIC AB 4-0 KS 27 (SUTURE) ×2 IMPLANT
SUT VIC AB 4-0 PS2 27 (SUTURE) ×2 IMPLANT
TAPE CLOTH SURG 4X10 WHT LF (GAUZE/BANDAGES/DRESSINGS) ×2 IMPLANT
TOWEL OR 17X24 6PK STRL BLUE (TOWEL DISPOSABLE) ×2 IMPLANT
TRAY FOLEY BAG SILVER LF 14FR (SET/KITS/TRAYS/PACK) IMPLANT

## 2017-03-18 NOTE — Addendum Note (Signed)
Addendum  created 03/18/17 1411 by Shanon PayorGregory, Alfred Harrel M, CRNA   Sign clinical note

## 2017-03-18 NOTE — Anesthesia Postprocedure Evaluation (Signed)
Anesthesia Post Note  Patient: Clovis CaoStephanie Baskett  Procedure(s) Performed: Procedure(s) (LRB): CESAREAN SECTION (N/A)     Patient location during evaluation: Mother Baby Anesthesia Type: Epidural Level of consciousness: awake and alert and oriented Pain management: pain level controlled Vital Signs Assessment: post-procedure vital signs reviewed and stable Respiratory status: spontaneous breathing and nonlabored ventilation Cardiovascular status: stable Postop Assessment: no headache, no backache, patient able to bend at knees, no signs of nausea or vomiting and adequate PO intake Anesthetic complications: no    Last Vitals:  Vitals:   03/18/17 1135 03/18/17 1138  BP:    Pulse:    Resp:    Temp:    SpO2: 96% 98%    Last Pain:  Vitals:   03/18/17 1337  TempSrc:   PainSc: 1    Pain Goal: Patients Stated Pain Goal: 3 (03/18/17 1337)               Madison HickmanGREGORY,Reegan Bouffard

## 2017-03-18 NOTE — Progress Notes (Signed)
Comfortable but does feel a lot of pain in back  BP 140/77   Pulse 100   Temp 98.2 F (36.8 C) (Oral)   Resp 18   Ht 5\' 9"  (1.753 m)   Wt 213 lb (96.6 kg)   SpO2 100%   BMI 31.45 kg/m   A&ox3 nml respirations Abd: soft, nt, gravid Cx: c/c/+1  Fht: 130s. nml variability, +accels, early decels and variabiles noted with contractions Toco: q 2-3 min  A/p: iup at 39.6 1. Arrest of descent - reviewed no change in station in 4 hours with pushing and laboring down; recommendation to proceed for c/s; reviewed risk including but not limited to bleeding, infection, injury to bowel, bladder, nerves, blood vessels, risk of further surgery; pt agrees and consent signed 2. Cat 2, fetal status overall ressuring 3. Prolonged rom, afebrile 4. gbs negative

## 2017-03-18 NOTE — Progress Notes (Signed)
Comfortable with epidural now  Patient Vitals for the past 24 hrs:  BP Temp Temp src Pulse Resp SpO2 Height Weight  03/18/17 0100 121/75 - - (!) 107 - - - -  03/18/17 0030 (!) 144/97 - - (!) 113 - - - -  03/18/17 0000 (!) 147/91 98.5 F (36.9 C) Oral 96 18 - - -  03/17/17 2330 (!) 151/84 - - 96 - - - -  03/17/17 2303 (!) 157/99 - - (!) 107 - - - -  03/17/17 2230 (!) 143/91 - - (!) 102 - - - -  03/17/17 2229 (!) 143/91 - - (!) 102 - - - -  03/17/17 2200 (!) 142/78 98.3 F (36.8 C) Oral (!) 101 - - - -  03/17/17 2130 139/84 - - 88 - - - -  03/17/17 2100 (!) 152/83 - - 98 - - - -  03/17/17 2030 (!) 145/90 - - (!) 101 - - - -  03/17/17 2000 (!) 141/86 98.2 F (36.8 C) Oral 90 - - - -  03/17/17 1930 139/85 - - 95 18 - - -  03/17/17 1915 135/82 97.9 F (36.6 C) Oral 91 18 - - -  03/17/17 1910 (!) 147/86 - - 86 18 - - -  03/17/17 1905 136/89 - - 85 18 - - -  03/17/17 1900 (!) 144/85 - - 87 18 - - -  03/17/17 1855 (!) 144/85 - - 93 18 - - -  03/17/17 1852 (!) 156/108 - - 90 18 - - -  03/17/17 1847 (!) 134/93 - - (!) 104 18 - - -  03/17/17 1845 (!) 149/94 - - 92 18 100 % - -  03/17/17 1830 131/79 - - 88 18 - - -  03/17/17 1800 (!) 151/101 - - 94 18 - - -  03/17/17 1743 - 98.2 F (36.8 C) Oral - - - - -  03/17/17 1736 (!) 144/84 - - 91 18 - - -  03/17/17 1700 137/79 - - 95 18 - - -  03/17/17 1630 (!) 137/93 - - 82 18 - - -  03/17/17 1625 (!) 147/104 98 F (36.7 C) Oral 80 18 - - -  03/17/17 1537 (!) 143/97 - - 95 18 - - -  03/17/17 1434 (!) 128/99 98.1 F (36.7 C) Oral (!) 105 18 - - -  03/17/17 1330 (!) 146/91 - - 98 18 - - -  03/17/17 1309 (!) 146/102 97.9 F (36.6 C) Oral 98 20 - 5\' 9"  (1.753 m) 213 lb (96.6 kg)  03/17/17 1246 125/90 - - (!) 102 - - - -  03/17/17 1231 (!) 135/93 - - 96 - - - -  03/17/17 1215 123/89 - - (!) 114 - - - -  03/17/17 1210 130/90 - - (!) 103 - - - -  03/17/17 1146 121/82 - - (!) 107 - - - -  03/17/17 1135 134/82 - - 98 - - - -  03/17/17 1125 (!)  145/98 97.7 F (36.5 C) Oral (!) 113 20 - - -   A&ox3 nml respirations Abd: soft, nt, gravid Cx: 10/10/+1 LE: +1 edema, nt bilat  Fht: 130s, nml variability, +accels, occ early decels, occ variables Toco: q 2-3 min  A/p: iup at 39.6 1. Srom, pitocin augmentation: plan svd; will labor down x1 hr then plan to begin pushing; prolonged ROM - now about 18 hrs s/p srom; afebrile - watch closely 2. Fetal status reassuring 3. gbs neg

## 2017-03-18 NOTE — Lactation Note (Signed)
This note was copied from a baby's chart. Lactation Consultation Note  Patient Name: Samantha Schmidt [id number]'UToday's Date: 03/18/2017 Reason for consult: Initial assessment   With this first time mom and term baby, now 7 hours old. Mom has successfully breast fed twice, and once baby was sucking on mom's areola. Mom has semi evert nipples, and large, soft breast. Mom has been latching in cradle hold. She was about to order food, to I told mom to call for lactation with next latch. MGM holding the baby at this time. Skin to skin encouraged and advantages reviewed. Mom has flat nipples, and I noted a sucking bruise on mom's left areola. Basic lactation and breast feeding teaching done. Mom encouraged to call for lactation with next latch, so I can observe and assist with latch.    Maternal Data Has patient been taught Hand Expression?: Yes  Feeding Feeding Type: Breast Fed Length of feed: 5 min  LATCH Score    Audible Swallowing:  (was not able to express more than a small drop of colostrum from each breast)  Type of Nipple: Flat (semi everted)  Comfort (Breast/Nipple): Soft / non-tender (mom has a sucking blister on her left areola, ny her nipple)        Interventions Interventions: Breast feeding basics reviewed;Skin to skin;Hand express;Breast compression;Adjust position;Support pillows;Position options;Expressed milk  Lactation Tools Discussed/Used     Consult Status Consult Status: Follow-up Date: 03/19/17 Follow-up type: In-patient    Alfred LevinsLee, Sharicka Pogorzelski Anne 03/18/2017, 1:34 PM

## 2017-03-18 NOTE — Progress Notes (Signed)
Pt comfortable Just started pushing again, feels rested and has energy to push  BP (!) 147/89   Pulse 96   Temp 98.2 F (36.8 C) (Oral)   Resp 18   Ht 5\' 9"  (1.753 m)   Wt 213 lb (96.6 kg)   SpO2 100%   BMI 31.45 kg/m    A&ox3 nml respirations Abd: soft, nt gravid Cx: complete/complete/+1 LE: trace to +1 edema NT bilat   Fht: 130s, nml variability; +accels, +variables with contractions - resolved Toco: q 1-2 min  A/p: iup at 39.6 1. Pitocin augmentation - pt initially rested/labored down (per her request), then pushed for w/o any change noted; then rested for about 30-45 min and now pushing again; I have discussed concern for arrest of descent w/o change of station in last 3 hrs - recommend pushing for another 30-45 min, but if no change, recommend proceed with c/s for delivery; pt and husband understand and no questions 2. fht cat 2, no cat 1 - follow closely 3. Prolonged ROM, afebrile

## 2017-03-18 NOTE — Transfer of Care (Signed)
Immediate Anesthesia Transfer of Care Note  Patient: Clovis CaoStephanie Heckmann  Procedure(s) Performed: Procedure(s): CESAREAN SECTION (N/A)  Patient Location: PACU  Anesthesia Type:Epidural  Level of Consciousness: awake, alert  and oriented  Airway & Oxygen Therapy: Patient Spontanous Breathing  Post-op Assessment: Report given to RN and Post -op Vital signs reviewed and stable  Post vital signs: Reviewed and stable  Last Vitals:  Vitals:   03/18/17 0331 03/18/17 0400  BP: 127/77 140/77  Pulse: (!) 108 100  Resp:    Temp:  36.8 C  SpO2:      Last Pain:  Vitals:   03/18/17 0400  TempSrc: Oral  PainSc:       Patients Stated Pain Goal: 7 (03/17/17 1431)  Complications: No apparent anesthesia complications

## 2017-03-18 NOTE — Consult Note (Signed)
Neonatology Note:   Attendance at C-section:    I was asked by Dr. Almquist to attend this primary C/S at term due to failure of descent. The mother is a G1P0 O pos, GBS neg with placenta previa early in pregnancy, resolved; and known LGA infant. ROM 21 hours prior to delivery, fluid clear. Infant vigorous with good spontaneous cry and tone. Delayed cord clamping was done. Needed no suctioning. Ap 9/9. Lungs clear to ausc in DR. To CN to care of Pediatrician.   Tamura Lasky C. Laaibah Wartman, MD 

## 2017-03-18 NOTE — Anesthesia Postprocedure Evaluation (Signed)
Anesthesia Post Note  Patient: Samantha Schmidt  Procedure(s) Performed: Procedure(s) (LRB): CESAREAN SECTION (N/A)     Anesthesia Post Evaluation  Last Vitals:  Vitals:   03/18/17 0615 03/18/17 0630  BP: 121/68 124/62  Pulse: 85 85  Resp: 18 (!) 25  Temp:    SpO2:      Last Pain:  Vitals:   03/18/17 0609  TempSrc: Oral  PainSc: 2    Pain Goal: Patients Stated Pain Goal: 7 (03/17/17 1431)               Elias Bordner

## 2017-03-18 NOTE — Plan of Care (Signed)
Problem: Activity: Goal: Ability to tolerate increased activity will improve Outcome: Progressing Pt ambulated to the bathroom without any dizziness or weakness.

## 2017-03-18 NOTE — Progress Notes (Signed)
Pt reports heartburn increasing and requested RN contact MD to ask for medication.  Dr. Juliene PinaMody notified and Pepcid 20mg  PO BID ordered.

## 2017-03-18 NOTE — Plan of Care (Signed)
Problem: Activity: Goal: Ability to tolerate increased activity will improve Outcome: Progressing Orthostatics done and pt ambulated to bathroom without difficulty.  Pt reported mild lightheadedness but did not feel that it impaired her ability to ambulate.

## 2017-03-19 ENCOUNTER — Encounter (HOSPITAL_COMMUNITY): Payer: Self-pay

## 2017-03-19 DIAGNOSIS — D62 Acute posthemorrhagic anemia: Secondary | ICD-10-CM | POA: Diagnosis not present

## 2017-03-19 LAB — CBC
HCT: 24.9 % — ABNORMAL LOW (ref 36.0–46.0)
HEMOGLOBIN: 9.1 g/dL — AB (ref 12.0–15.0)
MCH: 32.9 pg (ref 26.0–34.0)
MCHC: 36.5 g/dL — ABNORMAL HIGH (ref 30.0–36.0)
MCV: 89.9 fL (ref 78.0–100.0)
PLATELETS: 199 10*3/uL (ref 150–400)
RBC: 2.77 MIL/uL — AB (ref 3.87–5.11)
RDW: 13.7 % (ref 11.5–15.5)
WBC: 13.9 10*3/uL — ABNORMAL HIGH (ref 4.0–10.5)

## 2017-03-19 MED ORDER — MAGNESIUM OXIDE 400 (241.3 MG) MG PO TABS
400.0000 mg | ORAL_TABLET | Freq: Every day | ORAL | Status: DC
  Administered 2017-03-20: 400 mg via ORAL
  Filled 2017-03-19 (×3): qty 1

## 2017-03-19 MED ORDER — POLYSACCHARIDE IRON COMPLEX 150 MG PO CAPS
150.0000 mg | ORAL_CAPSULE | Freq: Every day | ORAL | Status: DC
  Administered 2017-03-20: 150 mg via ORAL
  Filled 2017-03-19: qty 1

## 2017-03-19 NOTE — Lactation Note (Signed)
This note was copied from a baby's chart. Lactation Consultation Note  Patient Name: Samantha Clovis CaoStephanie Lambeth [id number]'s Date: 03/19/2017 Reason for consult: Follow-up assessment   Baby 33 hours old and latched upon entering w/ prefilled #24NS. Baby very sleepy.  Attempted waking to finger syringe feed but baby was too sleepy. Assisted mother w/ DEBP.  Recommend she give volume pumped back to baby at next feeding w/ the difference w/ formula. Mom encouraged to feed baby 8-12 times/24 hours and with feeding cues at least q2.5-3 hours. Encouraged mother to post pump 4-5 times per day w/ DEBP. Discussed if baby is too sleepy to take a syringe, suggest trying slow flow nipple to get in extra volume.     Maternal Data    Feeding Feeding Type: Breast Fed Length of feed: 10 min  LATCH Score Latch: Repeated attempts needed to sustain latch, nipple held in mouth throughout feeding, stimulation needed to elicit sucking reflex.  Audible Swallowing: A few with stimulation  Type of Nipple: Flat  Comfort (Breast/Nipple): Soft / non-tender  Hold (Positioning): Assistance needed to correctly position infant at breast and maintain latch.  LATCH Score: 6  Interventions    Lactation Tools Discussed/Used Tools: Nipple Shields Nipple shield size: 24   Consult Status Consult Status: Follow-up Date: 03/20/17 Follow-up type: In-patient    Dahlia ByesBerkelhammer, Ruth [facility] 03/19/2017, 2:41 PM

## 2017-03-19 NOTE — Lactation Note (Signed)
This note was copied from a baby's chart. Lactation Consultation Note Mom called out to RN stating baby isn't BF and is jittery. Baby laying in crib w/onsie on not wrapped in blanket. Temp. OK. Maybe immature nervous system.? Blood sugar WDL per RN.  Latched baby to #20 NS, mom states slightly uncomfortable. Mom has seen blood in NS earlier. Noted some in #20 NS. Changed back to #24 NS. Harder for baby to latch, needs to flanges lips more. Mom states #24 comfortable.  Hand expression w/no colostrum obtained. ? Edema to breast. Nipples compress inwards. Lt. Nipple bruised.  Mom concerned baby isn't getting anything d/t no transfer noted in NS when feeding during the day. Mom requesting formula. Alimentum inserted in NS w/curve tip syring. Discussed supplementing w/formula, supply and demand.   Mom has DEBP in rm. Discussed pumping after BF, give any colostrum that can be hand expressed or pumped. Supplementing feeding sheet given. Report to RN.  Patient Name: Samantha Schmidt ZOXWR'UToday's Date: 03/19/2017 Reason for consult: Follow-up assessment   Maternal Data    Feeding Feeding Type: Formula Length of feed: 15 min  LATCH Score Latch: Repeated attempts needed to sustain latch, nipple held in mouth throughout feeding, stimulation needed to elicit sucking reflex.  Audible Swallowing: None  Type of Nipple: Flat  Comfort (Breast/Nipple): Filling, red/small blisters or bruises, mild/mod discomfort  Hold (Positioning): Full assist, staff holds infant at breast  LATCH Score: 3  Interventions Interventions: Breast feeding basics reviewed;Support pillows;Assisted with latch;Position options;Skin to skin;Breast massage;Hand express;DEBP;Breast compression;Adjust position  Lactation Tools Discussed/Used Tools: Pump;Nipple Shields Nipple shield size: 24 Breast pump type: Double-Electric Breast Pump Pump Review: Setup, frequency, and cleaning;Milk Storage Initiated by:: RN Date  initiated:: 03/19/17   Consult Status Consult Status: Follow-up Date: 03/19/17 Follow-up type: In-patient    Hattye Siegfried, Diamond NickelLAURA G 03/19/2017, 1:13 AM

## 2017-03-19 NOTE — Progress Notes (Signed)
POSTOPERATIVE DAY # 1 S/P CS - arrest of descent  S:         Reports feeling ok             Tolerating po intake / no nausea / no vomiting / + flatus / no BM             Bleeding is light             Pain controlled withmotrin and oxycodone             Up ad lib / ambulatory/ voiding QS  Newborn breast and formula feeding   O:  VS: BP 116/61   Pulse 75   Temp 98.3 F (36.8 C) (Oral)   Resp 18   Ht 5\' 9"  (1.753 m)   Wt 96.6 kg (213 lb)   SpO2 98%   Breastfeeding? Unknown   BMI 31.45 kg/m    LABS:               Recent Labs  03/17/17 1815 03/19/17 0605  WBC 9.2 13.9*  HGB 11.1* 9.1*  PLT 202 199               Bloodtype: --/--/O POS, O POS (09/01 1158)  Rubella: Immune (02/05 0000)                                            I&O: net negative 907ml             Physical Exam:             Alert and Oriented X3  Lungs: Clear and unlabored  Heart: regular rate and rhythm / no mumurs  Abdomen: soft, non-tender, non-distended             Fundus: firm, non-tender, U-1             Dressing intact with marked dried drainage              Incision:  approximated with suture / no erythema / no ecchymosis / dried drainage  Perineum: intact  Lochia: light  Extremities: trace edema, no calf pain or tenderness, negative Homans  A:        POD # 1 S/P CS - arrest of descent            ABL anemia  P:        Routine postoperative care              Start iron and magnesium             Anticipate DC tomorrow   Marlinda MikeBAILEY, Wallis Spizzirri CNM, MSN, FACNM 03/19/2017, 10:22 AM

## 2017-03-20 MED ORDER — OXYCODONE-ACETAMINOPHEN 5-325 MG PO TABS: 1.0000 | ORAL_TABLET | ORAL | 0 refills | Status: DC | PRN

## 2017-03-20 MED ORDER — FERROUS SULFATE 325 (65 FE) MG PO TABS: 325.0000 mg | ORAL_TABLET | Freq: Two times a day (BID) | ORAL | 3 refills | Status: DC

## 2017-03-20 MED ORDER — IBUPROFEN 600 MG PO TABS: 600.0000 mg | ORAL_TABLET | Freq: Four times a day (QID) | ORAL | 0 refills | Status: DC

## 2017-03-20 NOTE — Progress Notes (Signed)
POSTOPERATIVE DAY # 2 S/P Primary LTCS for arrest of descent, Baby Girl    S:         Reports feeling okay, having some incisional soreness when getting up and down - desires to go home today  Peds monitoring bili level on baby, but discharged baby and will follow outpatient             Tolerating po intake / no nausea / no vomiting / + flatus / no BM  Denies HA, visual changes, epigastric/RUQ pain   Denies dizziness, SOB, or CP             Bleeding is light             Pain controlled with Motrin and Percocet             Up ad lib / ambulatory/ voiding QS  Newborn breast feeding with nipple shield and formula supplementation - mom desires to breastfeed exclusively, but with baby's bili levels, she is supplementing, has not been pumping or putting baby to breast regularly   O:  VS: BP 111/80   Pulse 81   Temp 98.2 F (36.8 C) (Oral)   Resp 18   Ht 5\' 9"  (1.753 m)   Wt 96.6 kg (213 lb)   SpO2 99%   Breastfeeding? Unknown   BMI 31.45 kg/m    LABS:               Recent Labs  03/17/17 1815 03/19/17 0605  WBC 9.2 13.9*  HGB 11.1* 9.1*  PLT 202 199               Bloodtype: --/--/O POS, O POS (09/01 1158)  Rubella: Immune (02/05 0000)                                             I&O: Intake/Output    None                Physical Exam:             Alert and Oriented X3  Lungs: Clear and unlabored  Heart: regular rate and rhythm / no mumurs  Abdomen: soft, non-tender, non-distended, active bowel sounds in all quadrants             Fundus: firm, non-tender, U-2             Dressing: honeycomb dsg with steri-strips - old drainage noted, otherwise dry and intact              Incision:  approximated with sutures / no erythema / no ecchymosis / no drainage  Perineum: intact  Lochia: light, no clots  Extremities: +1 pedal edema, no calf pain or tenderness  A:        POD # 1 S/P Primary LTCS for arrest of descent            ABL Anemia - stable, asymptomatic  gHTN range BPs on  admission - stable since delivery  Hx. Of abnormal pap with HR HPV  P:        Routine postoperative care              Discharge home today  WOB discharge book and instructions given   Breast feeding support encouraged outpatient - advised to pump every 3 hours even if giving formula  Encouraged smart start nurse for f/u - pt. Declined and states she'd like to see Dr. Ernestina Penna at 2 weeks   F/u with Dr. Ernestina Penna at 2 weeks for interval visit, then 6 weeks - will need pap PP  Carlean Jews, MSN, CNM [facility]

## 2017-03-20 NOTE — Lactation Note (Addendum)
This note was copied from a baby's chart. Lactation Consultation Note  Patient Name: Girl Clovis CaoStephanie Prewitt ZOXWR'UToday's Date: 03/20/2017 Reason for consult: Follow-up assessment;Hyperbilirubinemia  Baby 53 hours old. Mom reports that she pumped earlier this morning and was not able to collect anything. However, mom states that she is seeing milk in the NS. Discussed jaundice with parents, and enc waking baby as needed to feed. Mom reports that pediatrician instructed her to nurse, then supplement with formula using NS and curve-tipped syringe. FOB reports a follow up bilirubin tomorrow morning. Mom given supplementation guidelines with review. Mom aware of OP/BFSG and LC phone line assistance after D/C. Enc mom to put baby to breast with cues, and at least by 3 hours. Enc supplementing with EBM/formula, and then post-pump in order to protect milk supply. Discussed the need to keep pumping while using NS. Mom reports no additional needs at this time. Mom aware of OP/BFSG and LC phone line assistance after D/C.   Maternal Data    Feeding Feeding Type: Formula Length of feed: 13 min  LATCH Score                   Interventions    Lactation Tools Discussed/Used Tools: Nipple Shields Nipple shield size: 24   Consult Status Consult Status: PRN    Sherlyn HayJennifer D Kayven Aldaco 03/20/2017, 10:35 AM

## 2017-03-20 NOTE — Discharge Summary (Signed)
Obstetric Discharge Summary   Patient Name: Samantha Schmidt DOB: January 31, 1981 MRN: [id number]  Date of Admission: 03/17/2017 Date of Discharge: 03/20/2017 Date of Delivery: 03/18/17 Gestational Age at Delivery: [redacted]w[redacted]d  Primary OB: [facility] - Dr. Ernestina Penna  Antepartum complications:  - AMA, nml genetic screening.  - GERD and controlled with Zantac.   - Placenta previa that resolved and has been stable at 2.2cm away from cervical os, anterior.  Last u/s was 2 wks ago with efw 7lb 8 oz (efw 66%), nml afi.   - HR hpv pap x2 yrs, colpo neg 12/17,  repeat pap pp planned. - Mild range bps on admission - labs done and normal.  Prenatal Labs:  ABO, Rh: --/--/O POS (09/01 1158) Antibody: NEG (09/01 1158) Rubella: !Error!immune RPR: Nonreactive (02/05 0000)  HBsAg: Negative (02/05 0000)  HIV: Non-reactive (02/05 0000)  GBS: Negative (08/01 0000)  1 hr Glucola - passed Genetic screening - nml NT/ultrasound Anatomy US: normal anatomy; placenta previa, now 2.2cm away from os, anterior  Admitting Diagnosis: Labor with SROM at term   Secondary Diagnoses: Patient Active Problem List   Diagnosis Date Noted  . Acute blood loss anemia 03/19/2017  . Cesarean delivery - Indication: arrest of descent 03/18/2017  . Postpartum care following cesarean delivery 9/2 03/18/2017    Augmentation: Pitocin Complications: None  Date of Delivery: 03/18/17 Delivered By: Dr. Amado Nash D. Renae Fickle CNM assist Delivery Type: primary cesarean section, low transverse incision for arrest of descent  Newborn Data: Live born female  Birth Weight: 8 lb 3.7 oz (3734 g) APGAR: 9, 9  Postpartum Course  (Cesarean Section):  Patient had an uncomplicated postpartum course.  By time of discharge on POD#2, her pain was controlled on oral pain medications; she had appropriate lochia and was ambulating, voiding without difficulty, tolerating regular diet and passing flatus.   She was deemed stable for discharge to home.      Labs: CBC Latest Ref Rng & Units 03/19/2017 03/17/2017 03/17/2017  WBC 4.0 - 10.5 K/uL 13.9(H) 9.2 8.2  Hemoglobin 12.0 - 15.0 g/dL 4.0(J) 11.1(L) 12.1  Hematocrit 36.0 - 46.0 % 24.9(L) 30.8(L) 33.8(L)  Platelets 150 - 400 K/uL 199 202 213   O POS  Physical exam:  BP 111/80   Pulse 81   Temp 98.2 F (36.8 C) (Oral)   Resp 18   Ht 5\' 9"  (1.753 m)   Wt 96.6 kg (213 lb)   SpO2 99%   Breastfeeding? Unknown   BMI 31.45 kg/m  General: alert and no distress Pulm: normal respiratory effort Lochia: appropriate Abdomen: soft, NT Uterine Fundus: firm, below umbilicus Perineum: healing well, no significant erythema, no significant edema Incision: small old shadowing noted, otherwise d/i, healing well, no significant drainage, no dehiscence, no significant erythema Extremities: No evidence of DVT seen on physical exam. No lower extremity edema.  Disposition: stable, discharge to home Baby Feeding: breast milk and formula Baby Disposition: home with mom  Contraception: unsure  Rh Immune globulin given: N/A Rubella vaccine given: N/A Tdap vaccine given in AP or PP setting: UTD Flu vaccine given in AP or PP setting: none on file   Plan:  Dalya Maselli was discharged to home in good condition. Follow-up appointment at [facility] in 2 weeks for BP check, incision check, supprt.  Discharge Instructions: Per After Visit Summary. Activity: Advance as tolerated. Pelvic rest for 6 weeks.  Refer to After Visit Summary Diet: Regular, Heart Healthy Discharge Medications: Allergies as of 03/20/2017   No  Known Allergies     Medication List    STOP taking these medications   ranitidine 150 MG tablet Commonly known as:  ZANTAC     TAKE these medications   ferrous sulfate 325 (65 FE) MG tablet Take 1 tablet (325 mg total) by mouth 2 (two) times daily with a meal.   fluticasone 50 MCG/ACT nasal spray Commonly known as:  FLONASE Place 1 spray into both nostrils daily.    ibuprofen 600 MG tablet Commonly known as:  ADVIL,MOTRIN Take 1 tablet (600 mg total) by mouth every 6 (six) hours.   oxyCODONE-acetaminophen 5-325 MG tablet Commonly known as:  PERCOCET/ROXICET Take 1 tablet by mouth every 4 (four) hours as needed (pain scale 4-7).   prenatal multivitamin Tabs tablet Take 1 tablet by mouth daily at 12 noon.            Discharge Care Instructions        Start     Ordered   03/20/17 0000  ibuprofen (ADVIL,MOTRIN) 600 MG tablet  Every 6 hours    Question:  Supervising Provider  Answer:  Rhoderick Moody E   03/20/17 1114   03/20/17 0000  oxyCODONE-acetaminophen (PERCOCET/ROXICET) 5-325 MG tablet  Every 4 hours PRN    Question:  Supervising Provider  Answer:  Rhoderick Moody E   03/20/17 1114   03/20/17 0000  ferrous sulfate 325 (65 FE) MG tablet  2 times daily with meals    Question:  Supervising Provider  Answer:  Rhoderick Moody E   03/20/17 1114   03/20/17 0000  Diet - low sodium heart healthy     03/20/17 1114   03/20/17 0000  Call MD for:  extreme fatigue     03/20/17 1114   03/20/17 0000  Call MD for:  persistant dizziness or light-headedness     03/20/17 1114   03/20/17 0000  Call MD for:  hives     03/20/17 1114   03/20/17 0000  Call MD for:  difficulty breathing, headache or visual disturbances     03/20/17 1114   03/20/17 0000  Call MD for:  redness, tenderness, or signs of infection (pain, swelling, redness, odor or green/yellow discharge around incision site)     03/20/17 1114   03/20/17 0000  Call MD for:  severe uncontrolled pain     03/20/17 1114   03/20/17 0000  Call MD for:  persistant nausea and vomiting     03/20/17 1114   03/20/17 0000  Call MD for:  temperature >100.4     03/20/17 1114   03/20/17 0000  Call MD for:     03/20/17 1114   03/20/17 0000  Lifting restrictions    Comments:  Weight restriction of 20 lbs.   03/20/17 1114   03/20/17 0000  Driving restriction     Comments:  Avoid driving for at least 2  weeks.   03/20/17 1114   03/20/17 0000  Sexual acrtivity    Comments:  No intercourse for 6 weeks   03/20/17 1114   03/20/17 0000   Remove dressing in 72 hours     03/20/17 1114     Outpatient follow up:  Follow-up Information    Noland Fordyce, MD. Schedule an appointment as soon as possible for a visit in 2 week(s).   Specialty:  Obstetrics and Gynecology Why:  Postpartum interval visit for BP check, incision check, then in 6 weeks Contact information: [address] [place] [address] [phone]  Signed:  Carlean JewsMeredith Mayleigh Tetrault, MSN, CNM [facility]

## 2017-03-22 ENCOUNTER — Inpatient Hospital Stay (HOSPITAL_COMMUNITY): Admission: RE | Admit: 2017-03-22 | Payer: Federal, State, Local not specified - PPO | Source: Ambulatory Visit

## 2017-04-19 DIAGNOSIS — L918 Other hypertrophic disorders of the skin: Secondary | ICD-10-CM | POA: Diagnosis not present

## 2017-04-19 DIAGNOSIS — L821 Other seborrheic keratosis: Secondary | ICD-10-CM | POA: Diagnosis not present

## 2017-05-01 DIAGNOSIS — B977 Papillomavirus as the cause of diseases classified elsewhere: Secondary | ICD-10-CM | POA: Diagnosis not present

## 2017-05-01 DIAGNOSIS — Z124 Encounter for screening for malignant neoplasm of cervix: Secondary | ICD-10-CM | POA: Diagnosis not present

## 2017-05-01 DIAGNOSIS — Z23 Encounter for immunization: Secondary | ICD-10-CM | POA: Diagnosis not present

## 2017-05-01 DIAGNOSIS — Z1151 Encounter for screening for human papillomavirus (HPV): Secondary | ICD-10-CM | POA: Diagnosis not present

## 2017-09-15 DIAGNOSIS — J209 Acute bronchitis, unspecified: Secondary | ICD-10-CM | POA: Diagnosis not present

## 2017-09-15 DIAGNOSIS — J029 Acute pharyngitis, unspecified: Secondary | ICD-10-CM | POA: Diagnosis not present

## 2017-10-23 DIAGNOSIS — Z5181 Encounter for therapeutic drug level monitoring: Secondary | ICD-10-CM | POA: Diagnosis not present

## 2017-10-23 DIAGNOSIS — Z0000 Encounter for general adult medical examination without abnormal findings: Secondary | ICD-10-CM | POA: Diagnosis not present

## 2017-10-23 DIAGNOSIS — E785 Hyperlipidemia, unspecified: Secondary | ICD-10-CM | POA: Diagnosis not present

## 2017-12-17 DIAGNOSIS — Z3169 Encounter for other general counseling and advice on procreation: Secondary | ICD-10-CM | POA: Diagnosis not present

## 2017-12-27 DIAGNOSIS — L821 Other seborrheic keratosis: Secondary | ICD-10-CM | POA: Diagnosis not present

## 2017-12-27 DIAGNOSIS — D225 Melanocytic nevi of trunk: Secondary | ICD-10-CM | POA: Diagnosis not present

## 2017-12-27 DIAGNOSIS — L858 Other specified epidermal thickening: Secondary | ICD-10-CM | POA: Diagnosis not present

## 2017-12-27 DIAGNOSIS — D2261 Melanocytic nevi of right upper limb, including shoulder: Secondary | ICD-10-CM | POA: Diagnosis not present

## 2018-01-14 DIAGNOSIS — G478 Other sleep disorders: Secondary | ICD-10-CM | POA: Diagnosis not present

## 2018-01-14 DIAGNOSIS — R682 Dry mouth, unspecified: Secondary | ICD-10-CM | POA: Diagnosis not present

## 2018-01-14 DIAGNOSIS — R0681 Apnea, not elsewhere classified: Secondary | ICD-10-CM | POA: Diagnosis not present

## 2018-01-14 DIAGNOSIS — R0683 Snoring: Secondary | ICD-10-CM | POA: Diagnosis not present

## 2018-02-11 DIAGNOSIS — G4733 Obstructive sleep apnea (adult) (pediatric): Secondary | ICD-10-CM | POA: Diagnosis not present

## 2018-02-19 DIAGNOSIS — G4733 Obstructive sleep apnea (adult) (pediatric): Secondary | ICD-10-CM | POA: Diagnosis not present

## 2018-02-27 DIAGNOSIS — G4733 Obstructive sleep apnea (adult) (pediatric): Secondary | ICD-10-CM | POA: Diagnosis not present

## 2018-03-15 DIAGNOSIS — R1011 Right upper quadrant pain: Secondary | ICD-10-CM | POA: Diagnosis not present

## 2018-03-15 DIAGNOSIS — M940 Chondrocostal junction syndrome [Tietze]: Secondary | ICD-10-CM | POA: Diagnosis not present

## 2018-03-22 DIAGNOSIS — G4733 Obstructive sleep apnea (adult) (pediatric): Secondary | ICD-10-CM | POA: Diagnosis not present

## 2018-03-25 DIAGNOSIS — G4733 Obstructive sleep apnea (adult) (pediatric): Secondary | ICD-10-CM | POA: Diagnosis not present

## 2018-04-08 DIAGNOSIS — Z3201 Encounter for pregnancy test, result positive: Secondary | ICD-10-CM | POA: Diagnosis not present

## 2018-04-10 DIAGNOSIS — Z23 Encounter for immunization: Secondary | ICD-10-CM | POA: Diagnosis not present

## 2018-04-21 DIAGNOSIS — G4733 Obstructive sleep apnea (adult) (pediatric): Secondary | ICD-10-CM | POA: Diagnosis not present

## 2018-05-03 DIAGNOSIS — Z3201 Encounter for pregnancy test, result positive: Secondary | ICD-10-CM | POA: Diagnosis not present

## 2018-05-13 DIAGNOSIS — G4733 Obstructive sleep apnea (adult) (pediatric): Secondary | ICD-10-CM | POA: Diagnosis not present

## 2018-05-21 DIAGNOSIS — Z3689 Encounter for other specified antenatal screening: Secondary | ICD-10-CM | POA: Diagnosis not present

## 2018-05-21 DIAGNOSIS — O09521 Supervision of elderly multigravida, first trimester: Secondary | ICD-10-CM | POA: Diagnosis not present

## 2018-05-21 DIAGNOSIS — Z118 Encounter for screening for other infectious and parasitic diseases: Secondary | ICD-10-CM | POA: Diagnosis not present

## 2018-05-21 DIAGNOSIS — Z3A10 10 weeks gestation of pregnancy: Secondary | ICD-10-CM | POA: Diagnosis not present

## 2018-05-21 LAB — OB RESULTS CONSOLE GC/CHLAMYDIA
Chlamydia: NEGATIVE
Gonorrhea: NEGATIVE

## 2018-05-21 LAB — OB RESULTS CONSOLE ANTIBODY SCREEN: Antibody Screen: NEGATIVE

## 2018-05-21 LAB — OB RESULTS CONSOLE RPR: RPR: NONREACTIVE

## 2018-05-21 LAB — OB RESULTS CONSOLE ABO/RH: RH Type: POSITIVE

## 2018-05-21 LAB — OB RESULTS CONSOLE RUBELLA ANTIBODY, IGM: Rubella: IMMUNE

## 2018-05-21 LAB — OB RESULTS CONSOLE HEPATITIS B SURFACE ANTIGEN: Hepatitis B Surface Ag: NEGATIVE

## 2018-05-21 LAB — OB RESULTS CONSOLE HIV ANTIBODY (ROUTINE TESTING): HIV: NONREACTIVE

## 2018-05-22 DIAGNOSIS — G4733 Obstructive sleep apnea (adult) (pediatric): Secondary | ICD-10-CM | POA: Diagnosis not present

## 2018-06-18 DIAGNOSIS — O09522 Supervision of elderly multigravida, second trimester: Secondary | ICD-10-CM | POA: Diagnosis not present

## 2018-06-18 DIAGNOSIS — Z3A14 14 weeks gestation of pregnancy: Secondary | ICD-10-CM | POA: Diagnosis not present

## 2018-06-21 DIAGNOSIS — G4733 Obstructive sleep apnea (adult) (pediatric): Secondary | ICD-10-CM | POA: Diagnosis not present

## 2018-07-01 DIAGNOSIS — Z361 Encounter for antenatal screening for raised alphafetoprotein level: Secondary | ICD-10-CM | POA: Diagnosis not present

## 2018-07-04 DIAGNOSIS — Z3A16 16 weeks gestation of pregnancy: Secondary | ICD-10-CM | POA: Diagnosis not present

## 2018-07-04 DIAGNOSIS — O09522 Supervision of elderly multigravida, second trimester: Secondary | ICD-10-CM | POA: Diagnosis not present

## 2018-07-18 DIAGNOSIS — Z3A18 18 weeks gestation of pregnancy: Secondary | ICD-10-CM | POA: Diagnosis not present

## 2018-07-18 DIAGNOSIS — Z363 Encounter for antenatal screening for malformations: Secondary | ICD-10-CM | POA: Diagnosis not present

## 2018-07-18 DIAGNOSIS — Z3686 Encounter for antenatal screening for cervical length: Secondary | ICD-10-CM | POA: Diagnosis not present

## 2018-07-18 DIAGNOSIS — O09522 Supervision of elderly multigravida, second trimester: Secondary | ICD-10-CM | POA: Diagnosis not present

## 2018-07-22 DIAGNOSIS — G4733 Obstructive sleep apnea (adult) (pediatric): Secondary | ICD-10-CM | POA: Diagnosis not present

## 2018-07-29 DIAGNOSIS — G4733 Obstructive sleep apnea (adult) (pediatric): Secondary | ICD-10-CM | POA: Diagnosis not present

## 2018-08-13 DIAGNOSIS — L821 Other seborrheic keratosis: Secondary | ICD-10-CM | POA: Diagnosis not present

## 2018-08-13 DIAGNOSIS — D225 Melanocytic nevi of trunk: Secondary | ICD-10-CM | POA: Diagnosis not present

## 2018-08-15 DIAGNOSIS — O09522 Supervision of elderly multigravida, second trimester: Secondary | ICD-10-CM | POA: Diagnosis not present

## 2018-08-15 DIAGNOSIS — Z3A22 22 weeks gestation of pregnancy: Secondary | ICD-10-CM | POA: Diagnosis not present

## 2018-08-15 DIAGNOSIS — Z362 Encounter for other antenatal screening follow-up: Secondary | ICD-10-CM | POA: Diagnosis not present

## 2018-08-22 DIAGNOSIS — G4733 Obstructive sleep apnea (adult) (pediatric): Secondary | ICD-10-CM | POA: Diagnosis not present

## 2018-09-13 DIAGNOSIS — Z3A27 27 weeks gestation of pregnancy: Secondary | ICD-10-CM | POA: Diagnosis not present

## 2018-09-13 DIAGNOSIS — O09522 Supervision of elderly multigravida, second trimester: Secondary | ICD-10-CM | POA: Diagnosis not present

## 2018-09-13 DIAGNOSIS — Z3689 Encounter for other specified antenatal screening: Secondary | ICD-10-CM | POA: Diagnosis not present

## 2018-09-20 DIAGNOSIS — G4733 Obstructive sleep apnea (adult) (pediatric): Secondary | ICD-10-CM | POA: Diagnosis not present

## 2018-10-01 DIAGNOSIS — Z23 Encounter for immunization: Secondary | ICD-10-CM | POA: Diagnosis not present

## 2018-10-01 DIAGNOSIS — Z3A29 29 weeks gestation of pregnancy: Secondary | ICD-10-CM | POA: Diagnosis not present

## 2018-10-01 DIAGNOSIS — O09523 Supervision of elderly multigravida, third trimester: Secondary | ICD-10-CM | POA: Diagnosis not present

## 2018-10-01 DIAGNOSIS — Z3689 Encounter for other specified antenatal screening: Secondary | ICD-10-CM | POA: Diagnosis not present

## 2018-10-08 ENCOUNTER — Other Ambulatory Visit: Payer: Self-pay | Admitting: Obstetrics

## 2018-10-15 DIAGNOSIS — Z3A31 31 weeks gestation of pregnancy: Secondary | ICD-10-CM | POA: Diagnosis not present

## 2018-10-15 DIAGNOSIS — O09523 Supervision of elderly multigravida, third trimester: Secondary | ICD-10-CM | POA: Diagnosis not present

## 2018-10-21 DIAGNOSIS — G4733 Obstructive sleep apnea (adult) (pediatric): Secondary | ICD-10-CM | POA: Diagnosis not present

## 2018-10-24 DIAGNOSIS — R3915 Urgency of urination: Secondary | ICD-10-CM | POA: Diagnosis not present

## 2018-10-29 DIAGNOSIS — R3915 Urgency of urination: Secondary | ICD-10-CM | POA: Diagnosis not present

## 2018-10-29 DIAGNOSIS — O09523 Supervision of elderly multigravida, third trimester: Secondary | ICD-10-CM | POA: Diagnosis not present

## 2018-10-29 DIAGNOSIS — Z3A33 33 weeks gestation of pregnancy: Secondary | ICD-10-CM | POA: Diagnosis not present

## 2018-11-11 DIAGNOSIS — Z3685 Encounter for antenatal screening for Streptococcus B: Secondary | ICD-10-CM | POA: Diagnosis not present

## 2018-11-11 DIAGNOSIS — Z3A35 35 weeks gestation of pregnancy: Secondary | ICD-10-CM | POA: Diagnosis not present

## 2018-11-11 DIAGNOSIS — O09523 Supervision of elderly multigravida, third trimester: Secondary | ICD-10-CM | POA: Diagnosis not present

## 2018-11-11 LAB — OB RESULTS CONSOLE GBS: GBS: NEGATIVE

## 2018-11-20 DIAGNOSIS — G4733 Obstructive sleep apnea (adult) (pediatric): Secondary | ICD-10-CM | POA: Diagnosis not present

## 2018-11-20 DIAGNOSIS — Z3A36 36 weeks gestation of pregnancy: Secondary | ICD-10-CM | POA: Diagnosis not present

## 2018-11-20 DIAGNOSIS — O09523 Supervision of elderly multigravida, third trimester: Secondary | ICD-10-CM | POA: Diagnosis not present

## 2018-11-21 ENCOUNTER — Other Ambulatory Visit: Payer: Self-pay | Admitting: Obstetrics

## 2018-11-25 DIAGNOSIS — O34219 Maternal care for unspecified type scar from previous cesarean delivery: Secondary | ICD-10-CM | POA: Diagnosis not present

## 2018-11-26 ENCOUNTER — Encounter (HOSPITAL_COMMUNITY): Payer: Self-pay | Admitting: *Deleted

## 2018-11-26 NOTE — Patient Instructions (Signed)
Samantha Schmidt  11/26/2018   Your procedure is scheduled on:  12/06/2018  Arrive at 1000 at Entrance C on [address] at [facility]  and [employer]. You are invited to use the FREE valet parking or use the Visitor's parking deck.  Pick up the phone at the desk and dial [phone].  Call this number if you have problems the morning of surgery: [phone]  Remember:   Do not eat food:(After Midnight) Desps de medianoche.  Do not drink clear liquids: (After Midnight) Desps de medianoche.  Take these medicines the morning of surgery with A SIP OF WATER:  none   Do not wear jewelry, make-up or nail polish.  Do not wear lotions, powders, or perfumes. Do not wear deodorant.  Do not shave 48 hours prior to surgery.  Do not bring valuables to the hospital.  [facility] is not   responsible for any belongings or valuables brought to the hospital.  Contacts, dentures or bridgework may not be worn into surgery.  Leave suitcase in the car. After surgery it may be brought to your room.  For patients admitted to the hospital, checkout time is 11:00 AM the day of              discharge.      Please read over the following fact sheets that you were given:     Preparing for Surgery

## 2018-12-03 DIAGNOSIS — Z3A38 38 weeks gestation of pregnancy: Secondary | ICD-10-CM | POA: Diagnosis not present

## 2018-12-03 DIAGNOSIS — O09523 Supervision of elderly multigravida, third trimester: Secondary | ICD-10-CM | POA: Diagnosis not present

## 2018-12-04 ENCOUNTER — Other Ambulatory Visit: Payer: Self-pay

## 2018-12-04 ENCOUNTER — Other Ambulatory Visit (HOSPITAL_COMMUNITY)
Admission: RE | Admit: 2018-12-04 | Discharge: 2018-12-04 | Disposition: A | Discharge status: Home / Self Care | Payer: Federal, State, Local not specified - PPO | Source: Ambulatory Visit | Attending: Obstetrics & Gynecology | Admitting: Obstetrics & Gynecology

## 2018-12-04 DIAGNOSIS — Z1159 Encounter for screening for other viral diseases: Secondary | ICD-10-CM | POA: Diagnosis not present

## 2018-12-04 NOTE — MAU Note (Signed)
Covid swab completed. Pt tolerated well. PT asymptomatic. Wash given for cesarean section

## 2018-12-05 LAB — NOVEL CORONAVIRUS, NAA (HOSP ORDER, SEND-OUT TO REF LAB; TAT 18-24 HRS): SARS-CoV-2, NAA: NOT DETECTED

## 2018-12-06 ENCOUNTER — Inpatient Hospital Stay (HOSPITAL_COMMUNITY)
Admission: RE | Admit: 2018-12-06 | Discharge: 2018-12-08 | DRG: 787 | Disposition: A | Discharge status: Home / Self Care | Payer: Federal, State, Local not specified - PPO | Attending: Obstetrics | Admitting: Obstetrics

## 2018-12-06 ENCOUNTER — Inpatient Hospital Stay (HOSPITAL_COMMUNITY): Admission: AD | Admit: 2018-12-06 | Payer: Self-pay | Source: Home / Self Care

## 2018-12-06 ENCOUNTER — Inpatient Hospital Stay (HOSPITAL_COMMUNITY): Payer: Federal, State, Local not specified - PPO | Admitting: Anesthesiology

## 2018-12-06 ENCOUNTER — Encounter (HOSPITAL_COMMUNITY): Payer: Self-pay | Admitting: *Deleted

## 2018-12-06 ENCOUNTER — Encounter (HOSPITAL_COMMUNITY)
Admission: RE | Disposition: A | Discharge status: Home / Self Care | Payer: Self-pay | Source: Home / Self Care | Attending: Obstetrics

## 2018-12-06 DIAGNOSIS — O34219 Maternal care for unspecified type scar from previous cesarean delivery: Secondary | ICD-10-CM | POA: Diagnosis not present

## 2018-12-06 DIAGNOSIS — D62 Acute posthemorrhagic anemia: Secondary | ICD-10-CM | POA: Diagnosis not present

## 2018-12-06 DIAGNOSIS — G4730 Sleep apnea, unspecified: Secondary | ICD-10-CM | POA: Diagnosis present

## 2018-12-06 DIAGNOSIS — O99354 Diseases of the nervous system complicating childbirth: Secondary | ICD-10-CM | POA: Diagnosis not present

## 2018-12-06 DIAGNOSIS — K219 Gastro-esophageal reflux disease without esophagitis: Secondary | ICD-10-CM | POA: Diagnosis not present

## 2018-12-06 DIAGNOSIS — O9962 Diseases of the digestive system complicating childbirth: Secondary | ICD-10-CM | POA: Diagnosis present

## 2018-12-06 DIAGNOSIS — O34211 Maternal care for low transverse scar from previous cesarean delivery: Principal | ICD-10-CM | POA: Diagnosis present

## 2018-12-06 DIAGNOSIS — O9081 Anemia of the puerperium: Secondary | ICD-10-CM | POA: Diagnosis not present

## 2018-12-06 DIAGNOSIS — Z3A39 39 weeks gestation of pregnancy: Secondary | ICD-10-CM | POA: Diagnosis not present

## 2018-12-06 DIAGNOSIS — Z3A00 Weeks of gestation of pregnancy not specified: Secondary | ICD-10-CM | POA: Diagnosis not present

## 2018-12-06 DIAGNOSIS — Z98891 History of uterine scar from previous surgery: Secondary | ICD-10-CM

## 2018-12-06 HISTORY — DX: Supervision of elderly multigravida, unspecified trimester: O09.529

## 2018-12-06 LAB — TYPE AND SCREEN
ABO/RH(D): O POS
Antibody Screen: NEGATIVE

## 2018-12-06 LAB — CBC
HCT: 30.6 % — ABNORMAL LOW (ref 36.0–46.0)
Hemoglobin: 10.3 g/dL — ABNORMAL LOW (ref 12.0–15.0)
MCH: 29.8 pg (ref 26.0–34.0)
MCHC: 33.7 g/dL (ref 30.0–36.0)
MCV: 88.4 fL (ref 80.0–100.0)
Platelets: 203 10*3/uL (ref 150–400)
RBC: 3.46 MIL/uL — ABNORMAL LOW (ref 3.87–5.11)
RDW: 13 % (ref 11.5–15.5)
WBC: 7.9 10*3/uL (ref 4.0–10.5)
nRBC: 0 % (ref 0.0–0.2)

## 2018-12-06 LAB — ABO/RH: ABO/RH(D): O POS

## 2018-12-06 SURGERY — Surgical Case
Anesthesia: Spinal | Wound class: Clean Contaminated

## 2018-12-06 MED ORDER — ZOLPIDEM TARTRATE 5 MG PO TABS: 5.0000 mg | ORAL_TABLET | Freq: Every evening | ORAL | Status: DC | PRN

## 2018-12-06 MED ORDER — SIMETHICONE 80 MG PO CHEW: 80.0000 mg | CHEWABLE_TABLET | ORAL | Status: DC | PRN

## 2018-12-06 MED ORDER — DIPHENHYDRAMINE HCL 50 MG/ML IJ SOLN: 12.5000 mg | INTRAMUSCULAR | Status: DC | PRN

## 2018-12-06 MED ORDER — FAMOTIDINE 20 MG PO TABS
20.0000 mg | ORAL_TABLET | Freq: Every day | ORAL | Status: DC
  Administered 2018-12-07: 20 mg via ORAL
  Filled 2018-12-06 (×2): qty 1

## 2018-12-06 MED ORDER — PHENYLEPHRINE HCL-NACL 20-0.9 MG/250ML-% IV SOLN
INTRAVENOUS | Status: AC
  Filled 2018-12-06: qty 250

## 2018-12-06 MED ORDER — OXYCODONE-ACETAMINOPHEN 5-325 MG PO TABS: 1.0000 | ORAL_TABLET | ORAL | Status: DC | PRN

## 2018-12-06 MED ORDER — NALBUPHINE HCL 10 MG/ML IJ SOLN: 5.0000 mg | INTRAMUSCULAR | Status: DC | PRN

## 2018-12-06 MED ORDER — SIMETHICONE 80 MG PO CHEW
80.0000 mg | CHEWABLE_TABLET | Freq: Three times a day (TID) | ORAL | Status: DC
  Administered 2018-12-07 – 2018-12-08 (×4): 80 mg via ORAL
  Filled 2018-12-06 (×4): qty 1

## 2018-12-06 MED ORDER — FENTANYL CITRATE (PF) 100 MCG/2ML IJ SOLN
INTRAMUSCULAR | Status: AC
  Filled 2018-12-06: qty 2

## 2018-12-06 MED ORDER — CEFAZOLIN SODIUM-DEXTROSE 2-4 GM/100ML-% IV SOLN
2.0000 g | INTRAVENOUS | Status: AC
  Administered 2018-12-06: 2 g via INTRAVENOUS

## 2018-12-06 MED ORDER — NALOXONE HCL 0.4 MG/ML IJ SOLN: 0.4000 mg | INTRAMUSCULAR | Status: DC | PRN

## 2018-12-06 MED ORDER — DIPHENHYDRAMINE HCL 25 MG PO CAPS: 25.0000 mg | ORAL_CAPSULE | Freq: Four times a day (QID) | ORAL | Status: DC | PRN

## 2018-12-06 MED ORDER — OXYTOCIN 40 UNITS IN NORMAL SALINE INFUSION - SIMPLE MED: 2.5000 [IU]/h | INTRAVENOUS | Status: DC

## 2018-12-06 MED ORDER — ONDANSETRON HCL 4 MG/2ML IJ SOLN: 4.0000 mg | Freq: Three times a day (TID) | INTRAMUSCULAR | Status: DC | PRN

## 2018-12-06 MED ORDER — KETOROLAC TROMETHAMINE 30 MG/ML IJ SOLN: 30.0000 mg | Freq: Four times a day (QID) | INTRAMUSCULAR | Status: DC | PRN

## 2018-12-06 MED ORDER — NALBUPHINE HCL 10 MG/ML IJ SOLN: 5.0000 mg | Freq: Once | INTRAMUSCULAR | Status: DC | PRN

## 2018-12-06 MED ORDER — ONDANSETRON HCL 4 MG/2ML IJ SOLN
INTRAMUSCULAR | Status: AC
  Filled 2018-12-06: qty 2

## 2018-12-06 MED ORDER — ACETAMINOPHEN 500 MG PO TABS
1000.0000 mg | ORAL_TABLET | Freq: Four times a day (QID) | ORAL | Status: AC
  Administered 2018-12-06 – 2018-12-07 (×4): 1000 mg via ORAL
  Filled 2018-12-06 (×4): qty 2

## 2018-12-06 MED ORDER — DIBUCAINE (PERIANAL) 1 % EX OINT: 1.0000 "application " | TOPICAL_OINTMENT | CUTANEOUS | Status: DC | PRN

## 2018-12-06 MED ORDER — SCOPOLAMINE 1 MG/3DAYS TD PT72
1.0000 | MEDICATED_PATCH | Freq: Once | TRANSDERMAL | Status: DC
  Administered 2018-12-06: 1.5 mg via TRANSDERMAL

## 2018-12-06 MED ORDER — CEFAZOLIN SODIUM-DEXTROSE 2-4 GM/100ML-% IV SOLN
INTRAVENOUS | Status: AC
  Filled 2018-12-06: qty 100

## 2018-12-06 MED ORDER — SCOPOLAMINE 1 MG/3DAYS TD PT72
MEDICATED_PATCH | TRANSDERMAL | Status: AC
  Filled 2018-12-06: qty 1

## 2018-12-06 MED ORDER — COCONUT OIL OIL: 1.0000 "application " | TOPICAL_OIL | Status: DC | PRN

## 2018-12-06 MED ORDER — FENTANYL CITRATE (PF) 100 MCG/2ML IJ SOLN
25.0000 ug | INTRAMUSCULAR | Status: DC | PRN
  Administered 2018-12-06: 50 ug via INTRAVENOUS
  Administered 2018-12-06: 25 ug via INTRAVENOUS

## 2018-12-06 MED ORDER — OXYTOCIN 40 UNITS IN NORMAL SALINE INFUSION - SIMPLE MED
INTRAVENOUS | Status: AC
  Filled 2018-12-06: qty 1000

## 2018-12-06 MED ORDER — MORPHINE SULFATE (PF) 0.5 MG/ML IJ SOLN
INTRAMUSCULAR | Status: AC
  Filled 2018-12-06: qty 10

## 2018-12-06 MED ORDER — SIMETHICONE 80 MG PO CHEW
80.0000 mg | CHEWABLE_TABLET | ORAL | Status: DC
  Administered 2018-12-06 – 2018-12-08 (×2): 80 mg via ORAL
  Filled 2018-12-06 (×2): qty 1

## 2018-12-06 MED ORDER — ONDANSETRON HCL 4 MG/2ML IJ SOLN
INTRAMUSCULAR | Status: DC | PRN
  Administered 2018-12-06: 4 mg via INTRAVENOUS

## 2018-12-06 MED ORDER — SODIUM CHLORIDE 0.9 % IV SOLN
INTRAVENOUS | Status: DC | PRN
  Administered 2018-12-06: 60 ug/min via INTRAVENOUS

## 2018-12-06 MED ORDER — SODIUM CHLORIDE 0.9 % IV SOLN
INTRAVENOUS | Status: DC | PRN
  Administered 2018-12-06: 40 [IU] via INTRAVENOUS

## 2018-12-06 MED ORDER — DIPHENHYDRAMINE HCL 25 MG PO CAPS: 25.0000 mg | ORAL_CAPSULE | ORAL | Status: DC | PRN

## 2018-12-06 MED ORDER — METOCLOPRAMIDE HCL 5 MG/ML IJ SOLN: 10.0000 mg | Freq: Once | INTRAMUSCULAR | Status: DC | PRN

## 2018-12-06 MED ORDER — SENNOSIDES-DOCUSATE SODIUM 8.6-50 MG PO TABS
2.0000 | ORAL_TABLET | ORAL | Status: DC
  Administered 2018-12-06 – 2018-12-08 (×2): 2 via ORAL
  Filled 2018-12-06 (×2): qty 2

## 2018-12-06 MED ORDER — KETOROLAC TROMETHAMINE 30 MG/ML IJ SOLN
INTRAMUSCULAR | Status: DC | PRN
  Administered 2018-12-06: 30 mg via INTRAVENOUS

## 2018-12-06 MED ORDER — SODIUM CHLORIDE 0.9% FLUSH: 3.0000 mL | INTRAVENOUS | Status: DC | PRN

## 2018-12-06 MED ORDER — BUPIVACAINE IN DEXTROSE 0.75-8.25 % IT SOLN
INTRATHECAL | Status: DC | PRN
  Administered 2018-12-06: 1.4 mL via INTRATHECAL

## 2018-12-06 MED ORDER — MENTHOL 3 MG MT LOZG: 1.0000 | LOZENGE | OROMUCOSAL | Status: DC | PRN

## 2018-12-06 MED ORDER — MIDAZOLAM HCL 2 MG/2ML IJ SOLN
INTRAMUSCULAR | Status: DC | PRN
  Administered 2018-12-06 (×2): 1 mg via INTRAVENOUS

## 2018-12-06 MED ORDER — LACTATED RINGERS IV SOLN: INTRAVENOUS | Status: DC

## 2018-12-06 MED ORDER — IBUPROFEN 800 MG PO TABS
800.0000 mg | ORAL_TABLET | Freq: Three times a day (TID) | ORAL | Status: DC
  Administered 2018-12-07 – 2018-12-08 (×4): 800 mg via ORAL
  Filled 2018-12-06 (×6): qty 1

## 2018-12-06 MED ORDER — WITCH HAZEL-GLYCERIN EX PADS: 1.0000 "application " | MEDICATED_PAD | CUTANEOUS | Status: DC | PRN

## 2018-12-06 MED ORDER — TETANUS-DIPHTH-ACELL PERTUSSIS 5-2.5-18.5 LF-MCG/0.5 IM SUSP: 0.5000 mL | Freq: Once | INTRAMUSCULAR | Status: DC

## 2018-12-06 MED ORDER — MEPERIDINE HCL 25 MG/ML IJ SOLN: 6.2500 mg | INTRAMUSCULAR | Status: DC | PRN

## 2018-12-06 MED ORDER — PRENATAL MULTIVITAMIN CH
1.0000 | ORAL_TABLET | Freq: Every day | ORAL | Status: DC
  Administered 2018-12-07: 1 via ORAL
  Filled 2018-12-06: qty 1

## 2018-12-06 MED ORDER — NALOXONE HCL 4 MG/10ML IJ SOLN
1.0000 ug/kg/h | INTRAVENOUS | Status: DC | PRN
  Filled 2018-12-06: qty 5

## 2018-12-06 MED ORDER — LACTATED RINGERS IV SOLN
INTRAVENOUS | Status: DC | PRN
  Administered 2018-12-06: 14:00:00 via INTRAVENOUS

## 2018-12-06 MED ORDER — LACTATED RINGERS IV SOLN
INTRAVENOUS | Status: DC
  Administered 2018-12-06: via INTRAVENOUS

## 2018-12-06 MED ORDER — FENTANYL CITRATE (PF) 100 MCG/2ML IJ SOLN
INTRAMUSCULAR | Status: DC | PRN
  Administered 2018-12-06: 25 ug via INTRAVENOUS
  Administered 2018-12-06: 15 ug via INTRATHECAL
  Administered 2018-12-06: 35 ug via INTRAVENOUS
  Administered 2018-12-06: 25 ug via INTRAVENOUS

## 2018-12-06 MED ORDER — LACTATED RINGERS IV SOLN
INTRAVENOUS | Status: DC
  Administered 2018-12-06 (×2): via INTRAVENOUS

## 2018-12-06 MED ORDER — MIDAZOLAM HCL 2 MG/2ML IJ SOLN
INTRAMUSCULAR | Status: AC
  Filled 2018-12-06: qty 2

## 2018-12-06 MED ORDER — MORPHINE SULFATE (PF) 0.5 MG/ML IJ SOLN
INTRAMUSCULAR | Status: DC | PRN
  Administered 2018-12-06: .15 mg via INTRATHECAL

## 2018-12-06 SURGICAL SUPPLY — 34 items
BENZOIN TINCTURE PRP APPL 2/3 (GAUZE/BANDAGES/DRESSINGS) ×2 IMPLANT
CHLORAPREP W/TINT 26ML (MISCELLANEOUS) ×2 IMPLANT
CLAMP CORD UMBIL (MISCELLANEOUS) IMPLANT
CLOTH BEACON ORANGE TIMEOUT ST (SAFETY) ×2 IMPLANT
DRSG OPSITE POSTOP 4X10 (GAUZE/BANDAGES/DRESSINGS) ×2 IMPLANT
ELECT REM PT RETURN 9FT ADLT (ELECTROSURGICAL) ×2
ELECTRODE REM PT RTRN 9FT ADLT (ELECTROSURGICAL) ×1 IMPLANT
EXTRACTOR VACUUM M CUP 4 TUBE (SUCTIONS) IMPLANT
GLOVE BIO SURGEON STRL SZ 6.5 (GLOVE) ×2 IMPLANT
GLOVE BIOGEL PI IND STRL 7.0 (GLOVE) ×2 IMPLANT
GLOVE BIOGEL PI INDICATOR 7.0 (GLOVE) ×2
GOWN STRL REUS W/TWL LRG LVL3 (GOWN DISPOSABLE) ×4 IMPLANT
KIT ABG SYR 3ML LUER SLIP (SYRINGE) IMPLANT
NEEDLE HYPO 22GX1.5 SAFETY (NEEDLE) IMPLANT
NEEDLE HYPO 25X5/8 SAFETYGLIDE (NEEDLE) IMPLANT
NS IRRIG 1000ML POUR BTL (IV SOLUTION) ×2 IMPLANT
PACK C SECTION WH (CUSTOM PROCEDURE TRAY) ×2 IMPLANT
PAD OB MATERNITY 4.3X12.25 (PERSONAL CARE ITEMS) ×2 IMPLANT
PENCIL SMOKE EVAC W/HOLSTER (ELECTROSURGICAL) ×2 IMPLANT
RETAINER VISCERAL (MISCELLANEOUS) ×2 IMPLANT
STRIP CLOSURE SKIN 1/2X4 (GAUZE/BANDAGES/DRESSINGS) ×2 IMPLANT
SUT MON AB 4-0 PS1 27 (SUTURE) ×2 IMPLANT
SUT PLAIN 0 NONE (SUTURE) IMPLANT
SUT PLAIN 2 0 XLH (SUTURE) IMPLANT
SUT VIC AB 0 CT1 36 (SUTURE) ×4 IMPLANT
SUT VIC AB 0 CTX 36 (SUTURE) ×4
SUT VIC AB 0 CTX36XBRD ANBCTRL (SUTURE) ×4 IMPLANT
SUT VIC AB 2-0 CT1 27 (SUTURE) ×1
SUT VIC AB 2-0 CT1 TAPERPNT 27 (SUTURE) ×1 IMPLANT
SUT VIC AB 4-0 KS 27 (SUTURE) ×2 IMPLANT
SYR CONTROL 10ML LL (SYRINGE) IMPLANT
TOWEL OR 17X24 6PK STRL BLUE (TOWEL DISPOSABLE) ×2 IMPLANT
TRAY FOLEY W/BAG SLVR 14FR LF (SET/KITS/TRAYS/PACK) IMPLANT
WATER STERILE IRR 1000ML POUR (IV SOLUTION) ×2 IMPLANT

## 2018-12-06 NOTE — H&P (Signed)
Samantha Schmidt is a 38 y.o. G2P1001 at [redacted]w[redacted]d presenting for RCS. Pt notes rare contractions  . Good fetal movement, No vaginal bleeding, not leaking fluid .  [facility] at [facility] since 8 wks - dated by LMP c/w 8 wk u/s - prior c/s for arrest, plan RCS - sleep apnea, on CPAP - recent UTI 0 fetal growth 35 wks, 92%   Prenatal Transfer Tool  Maternal Diabetes: No Genetic Screening: Normal Maternal Ultrasounds/Referrals: Normal Fetal Ultrasounds or other Referrals:  None Maternal Substance Abuse:  No Significant Maternal Medications:  None Significant Maternal Lab Results: None     OB History    Gravida  2   Para  1   Term  1   Preterm      AB      Living  1     SAB      TAB      Ectopic      Multiple  0   Live Births  1          Past Medical History:  Diagnosis Date  . AMA (advanced maternal age) multigravida 35+   . GERD (gastroesophageal reflux disease)   . Rosacea   . Sleep apnea   . Sleep apnea   . Vaginal Pap smear, abnormal    High Risk HPV   Past Surgical History:  Procedure Laterality Date  . arm surgery    . CESAREAN SECTION N/A 03/18/2017   Procedure: CESAREAN SECTION;  Surgeon: Vick Frees, MD;  Location: [facility];  Service: Obstetrics;  Laterality: N/A;  . COLPOSCOPY    . WISDOM TOOTH EXTRACTION     Family History: family history includes Hyperlipidemia in her father and mother; Hypertension in her father and mother. Social History:  reports that she has never smoked. She has never used smokeless tobacco. She reports that she does not drink alcohol or use drugs.  Review of Systems - Negative except discomfort of preg     Height 5\' 9"  (1.753 m), weight 98.9 kg, unknown if currently breastfeeding.  Physical Exam:  Gen: well appearing, no distress  CBC    Component Value Date/Time   WBC 7.9 12/06/2018 1032   RBC 3.46 (L) 12/06/2018 1032   HGB 10.3 (L) 12/06/2018 1032   HCT 30.6 (L) 12/06/2018 1032   PLT  203 12/06/2018 1032   MCV 88.4 12/06/2018 1032   MCH 29.8 12/06/2018 1032   MCHC 33.7 12/06/2018 1032   RDW 13.0 12/06/2018 1032      Prenatal labs: ABO, Rh: O/Positive/-- (11/05 0000) Antibody: Negative (11/05 0000) Rubella: Immune (11/05 0000) RPR: Nonreactive (11/05 0000)  HBsAg: Negative (11/05 0000)  HIV: Non-reactive (11/05 0000)  GBS: Negative (04/27 0000)  1 hr Glucola neg  Genetic screening normal Panorama, nl AFP Anatomy US normal   Assessment/Plan: 38 y.o. G2P1001 at [redacted]w[redacted]d Prior c/s, for RCS. R/B d/w pt Sleep apnea, cont CPAP  Anemia- follow CBC   Lendon Colonel 12/06/2018 1:34 PM

## 2018-12-06 NOTE — Transfer of Care (Signed)
Immediate Anesthesia Transfer of Care Note  Patient: Samantha Schmidt  Procedure(s) Performed: Repeat CESAREAN SECTION (N/A )  Patient Location: PACU  Anesthesia Type:Spinal  Level of Consciousness: awake, alert , oriented and patient cooperative  Airway & Oxygen Therapy: Patient Spontanous Breathing  Post-op Assessment: Report given to RN and Post -op Vital signs reviewed and stable  Post vital signs: Reviewed and stable  Last Vitals:  Vitals Value Taken Time  BP 111/83 12/06/2018  2:56 PM  Temp    Pulse 80 12/06/2018  2:59 PM  Resp 23 12/06/2018  2:59 PM  SpO2 100 % 12/06/2018  2:59 PM  Vitals shown include unvalidated device data.  Last Pain:  Vitals:   12/06/18 1013  TempSrc: Oral  PainSc: 0-No pain      Patients Stated Pain Goal: 4 (12/06/18 1013)  Complications: No apparent anesthesia complications

## 2018-12-06 NOTE — Brief Op Note (Signed)
12/06/2018  2:45 PM  PATIENT:  Clovis Cao  38 y.o. female  PRE-OPERATIVE DIAGNOSIS:  Previous Cesarean Section  POST-OPERATIVE DIAGNOSIS:  Previous Cesarean Section  PROCEDURE:  Procedure(s) with comments: Repeat CESAREAN SECTION (N/A) - EDD: 12/13/18  Low-transverse cesarean section with 2 layer closure  SURGEON:  Surgeon(s) and Role:    Noland Fordyce, MD - Primary  PHYSICIAN ASSISTANT:   ASSISTANTS: Arlan Organ, CNM  ANESTHESIA:   spinal  EBL:  266 mL   BLOOD ADMINISTERED:none  DRAINS: Urinary Catheter (Foley)   LOCAL MEDICATIONS USED:  NONE  SPECIMEN:  Source of Specimen:  Placenta  DISPOSITION OF SPECIMEN:  Labor and delivery  COUNTS:  YES  TOURNIQUET:  * No tourniquets in log *  DICTATION: .  Completed in epic  PLAN OF CARE: Admit to inpatient   PATIENT DISPOSITION:  PACU - hemodynamically stable.   Delay start of Pharmacological VTE agent (>24hrs) due to surgical blood loss or risk of bleeding: yes

## 2018-12-06 NOTE — Op Note (Signed)
12/06/2018  2:45 PM  PATIENT:  Samantha CaoStephanie Schmidt  38 y.o. female  PRE-OPERATIVE DIAGNOSIS:  Previous Cesarean Section  POST-OPERATIVE DIAGNOSIS:  Previous Cesarean Section  PROCEDURE:  Procedure(s) with comments: Repeat CESAREAN SECTION (N/A) - EDD: 12/13/18  Low-transverse cesarean section with 2 layer closure  SURGEON:  Surgeon(s) and Role:    Noland Fordyce* Kalden Wanke, MD - Primary  PHYSICIAN ASSISTANT:   ASSISTANTS: Arlan Organaniela Paul, CNM  ANESTHESIA:   spinal  EBL:  266 mL   BLOOD ADMINISTERED:none  DRAINS: Urinary Catheter (Foley)   LOCAL MEDICATIONS USED:  NONE  SPECIMEN:  Source of Specimen:  Placenta  DISPOSITION OF SPECIMEN:  Labor and delivery  COUNTS:  YES  TOURNIQUET:  * No tourniquets in log *  DICTATION: .  Completed in epic  PLAN OF CARE: Admit to inpatient   PATIENT DISPOSITION:  PACU - hemodynamically stable.   Delay start of Pharmacological VTE agent (>24hrs) due to surgical blood loss or risk of bleeding: yes     Findings:  @BABYSEXEBC @ infant,  APGAR (1 MIN): 8   APGAR (5 MINS): 9   APGAR (10 MINS):   Normal uterus, tubes and ovaries, normal placenta. 3VC, clear amniotic fluid, separation of the lower rectus muscles and peritoneal window, bladder adhesed to anterior fascia  EBL: 250 cc Antibiotics:   2g Ancef Complications: none  Indications: This is a 38 y.o. year-old, G2, P1 at 4557w0d admitted for repeat cesarean section. Risks benefits and alternatives of the procedure were discussed with the patient who agreed to proceed  Procedure:  After informed consent was obtained the patient was taken to the operating room where spinal anesthesia was initiated.  She was prepped and draped in the normal sterile fashion in dorsal supine position with a leftward tilt.  A foley catheter was in place.  A Pfannenstiel skin incision was made 2 cm above the pubic symphysis in the midline with the scalpel, over the old Pfannenstiel scar.  Dissection was carried down  with the Bovie cautery until the fascia was reached. The fascia was incised in the midline. The incision was extended laterally with the Mayo scissors. The inferior aspect of the fascial incision was grasped with the Coker clamps, elevated up and the underlying rectus muscles were dissected off sharply. The superior aspect of the fascial incision was grasped with the Coker clamps elevated up and the underlying rectus muscles were dissected off sharply.  The peritoneum was entered during the fascial dissection. The peritoneal incision was extended superiorly and inferiorly with good visualization of the bladder.  Sharp dissection was done with the Metzenbaums around the scarred bladder to the anterior fascia.  The bladder blade was inserted and palpation was done to assess the fetal position and the location of the uterine vessels. The lower segment of the uterus was incised sharply with the scalpel and extended  bluntly in the cephalo-caudal fashion. The infant was grasped, brought to the incision,  rotated and the infant was delivered with fundal pressure. The nose and mouth were bulb suctioned. The cord was clamped and cut after 1 minute delay. The infant was handed off to the waiting pediatrician. The placenta was expressed. The uterus was exteriorized. The uterus was cleared of all clots and debris. The uterine incision was repaired with 0 Vicryl in a running locked fashion.  A second layer of the same suture was used in an imbricating fashion to obtain excellent hemostasis.  About 6 additional figure-of-eight sutures were placed along the midpoint of the  incision for hemostasis.  Of note the myometrium was quite thick at the area of the hysterotomy.  The uterus was then returned to the abdomen, the gutters were cleared of all clots and debris. The uterine incision was reinspected and found to be hemostatic. The peritoneum was grasped and closed with 2-0 Vicryl in a running fashion. The cut muscle edges and the  underside of the fascia were inspected and found to be hemostatic. The fascia was closed with 0 Vicryl in 2 halves. The subcutaneous tissue was irrigated. Scarpa's layer was closed with a 2-0 plain gut suture. The skin was closed with a 4-0 Monocryl in a single layer. The patient tolerated the procedure well. Sponge lap and needle counts were correct x3 and patient was taken to the recovery room in a stable condition.  Lendon Colonel 12/06/2018 2:47 PM

## 2018-12-06 NOTE — Anesthesia Preprocedure Evaluation (Signed)
Anesthesia Evaluation  Patient identified by MRN, date of birth, ID band Patient awake    Reviewed: Allergy & Precautions, NPO status , Patient's Chart, lab work & pertinent test results  Airway Mallampati: II  TM Distance: >3 FB Neck ROM: Full    Dental no notable dental hx.    Pulmonary sleep apnea and Continuous Positive Airway Pressure Ventilation ,    Pulmonary exam normal breath sounds clear to auscultation       Cardiovascular negative cardio ROS Normal cardiovascular exam Rhythm:Regular Rate:Normal     Neuro/Psych negative neurological ROS  negative psych ROS   GI/Hepatic negative GI ROS, Neg liver ROS,   Endo/Other  negative endocrine ROS  Renal/GU negative Renal ROS  negative genitourinary   Musculoskeletal negative musculoskeletal ROS (+)   Abdominal   Peds negative pediatric ROS (+)  Hematology negative hematology ROS (+)   Anesthesia Other Findings   Reproductive/Obstetrics (+) Pregnancy                             Anesthesia Physical Anesthesia Plan  ASA: II  Anesthesia Plan: Spinal   Post-op Pain Management:    Induction:   PONV Risk Score and Plan: 2 and Ondansetron and Treatment may vary due to age or medical condition  Airway Management Planned: Natural Airway  Additional Equipment:   Intra-op Plan:   Post-operative Plan:   Informed Consent: I have reviewed the patients History and Physical, chart, labs and discussed the procedure including the risks, benefits and alternatives for the proposed anesthesia with the patient or authorized representative who has indicated his/her understanding and acceptance.     Dental advisory given  Plan Discussed with: CRNA  Anesthesia Plan Comments:         Anesthesia Quick Evaluation

## 2018-12-06 NOTE — Anesthesia Postprocedure Evaluation (Signed)
Anesthesia Post Note  Patient: Samantha Schmidt  Procedure(s) Performed: Repeat CESAREAN SECTION (N/A )     Patient location during evaluation: PACU Anesthesia Type: Spinal Level of consciousness: awake and alert Pain management: pain level controlled Vital Signs Assessment: post-procedure vital signs reviewed and stable Respiratory status: spontaneous breathing and respiratory function stable Cardiovascular status: blood pressure returned to baseline and stable Postop Assessment: no headache, no backache, spinal receding and no apparent nausea or vomiting Anesthetic complications: no    Last Vitals:  Vitals:   12/06/18 1515 12/06/18 1530  BP: 115/68 124/76  Pulse: 77 76  Resp: 20 16  Temp:    SpO2: 100% 98%    Last Pain:  Vitals:   12/06/18 1530  TempSrc:   PainSc: 6    Pain Goal: Patients Stated Pain Goal: 4 (12/06/18 1013)              Epidural/Spinal Function Cutaneous sensation: Tingles (12/06/18 1515), Patient able to flex knees: No (12/06/18 1515), Patient able to lift hips off bed: No (12/06/18 1515), Back pain beyond tenderness at insertion site: No (12/06/18 1515), Progressively worsening motor and/or sensory loss: No (12/06/18 1515), Bowel and/or bladder incontinence post epidural: No (12/06/18 1515)  Phillips Grout

## 2018-12-07 ENCOUNTER — Encounter (HOSPITAL_COMMUNITY): Payer: Self-pay | Admitting: Obstetrics

## 2018-12-07 LAB — CBC
HCT: 28.3 % — ABNORMAL LOW (ref 36.0–46.0)
Hemoglobin: 9.8 g/dL — ABNORMAL LOW (ref 12.0–15.0)
MCH: 30.3 pg (ref 26.0–34.0)
MCHC: 34.6 g/dL (ref 30.0–36.0)
MCV: 87.6 fL (ref 80.0–100.0)
Platelets: 220 10*3/uL (ref 150–400)
RBC: 3.23 MIL/uL — ABNORMAL LOW (ref 3.87–5.11)
RDW: 13 % (ref 11.5–15.5)
WBC: 8.3 10*3/uL (ref 4.0–10.5)
nRBC: 0 % (ref 0.0–0.2)

## 2018-12-07 LAB — RPR: RPR Ser Ql: NONREACTIVE

## 2018-12-07 MED ORDER — POLYSACCHARIDE IRON COMPLEX 150 MG PO CAPS: 150.0000 mg | ORAL_CAPSULE | Freq: Every day | ORAL | Status: DC

## 2018-12-07 MED ORDER — MAGNESIUM OXIDE 400 (241.3 MG) MG PO TABS
400.0000 mg | ORAL_TABLET | Freq: Every day | ORAL | Status: DC
  Administered 2018-12-07: 400 mg via ORAL
  Filled 2018-12-07: qty 1

## 2018-12-07 MED ORDER — TRAMADOL HCL 50 MG PO TABS
50.0000 mg | ORAL_TABLET | Freq: Four times a day (QID) | ORAL | Status: DC | PRN
  Administered 2018-12-07: 50 mg via ORAL
  Filled 2018-12-07: qty 1

## 2018-12-07 MED ORDER — OXYCODONE-ACETAMINOPHEN 5-325 MG PO TABS
1.0000 | ORAL_TABLET | ORAL | Status: DC | PRN
  Administered 2018-12-08 (×2): 1 via ORAL
  Filled 2018-12-07 (×2): qty 1

## 2018-12-07 MED ORDER — BISACODYL 10 MG RE SUPP: 10.0000 mg | Freq: Every evening | RECTAL | Status: DC | PRN

## 2018-12-07 NOTE — Progress Notes (Signed)
POSTOPERATIVE DAY # 1 S/P CS-repeat  Subjective:  reports feeling ok- hurting a lot with movement today / concerned about going home with toddler tolerating po intake / no nausea / no vomiting / + flatus / no BM pain controlled with Motrin and oxycodone up ad lib / not ambulatory yet / voiding QS  Newborn Breast  Objective: VS: BP 126/66 (BP Location: Right Arm)   Pulse 70   Temp 97.7 F (36.5 C) (Oral)   Resp 16   Ht 5\' 9"  (1.753 m)   Wt 98.9 kg   SpO2 99%   BMI 32.19 kg/m        LABS:  Recent Labs    12/06/18 1032 12/07/18 0518  WBC 7.9 8.3  HGB 10.3* 9.8*  PLT 203 220    Bloodtype: --/--/O POS, O POS Performed at [facility], [address], [place], [place] [address]  [phone]/22 1029)  Rubella: Immune (11/05 0000)                                           I&O: Intake/Output      05/22 0701 - 05/23 0700 05/23 0701 - 05/24 0700   P.O. 420    I.V. (mL/kg) 2480.2 (25.1)    Total Intake(mL/kg) 2900.2 (29.3)    Urine (mL/kg/hr) 2750 450 (0.8)   Blood 286    Total Output 3036 450   Net -135.8 -450         Physical Exam: alert and oriented X3 without any distress or pain lung fields are clear and unlabored heart rate regular / normal rhythm / no mumurs abdomen soft, non-tender, non-distended  / bowel sounds are hypoactive uterine fundus firm, non-tender, Ueven surgical incision assessed - honeycomb dressing with marked drainage 2/3 of dressing - no active drainage  lochia: light extremities: trace edema, no calf pain or tenderness  Assessment / Plan:          POD # 1 CS-repeat routine postoperative care - change dressing today - anticipatory care at home with toddler IDA with compounded blood loss postop - start iron and magnesium requests hospital stay as long as possible for pain management  Marlinda Mike CNM, MSN, [facility] 12/07/2018, 12:35 PM

## 2018-12-07 NOTE — Lactation Note (Signed)
This note was copied from a baby's chart. Lactation Consultation Note Baby 23 hrs old. BF at 2230.  Mom states baby has been sleepy not waking up. Has tried multiple times. Baby laying in crib in sleeper no blanket. Skin cool. Baby stirring, passing gas. LC checked diaper, dry. Samantha Schmidt swaddled baby. LC attempted oral stimulation to initiate suckling. Baby not interested. Wouldn't suckle on gloved finger. Newborn behavior, feeding habits, STS, I&O, breast massage, feeding positioning, support, supply and demand discussed. Mom has 39 1/2 yr old at home that she tried to BF while in the hospital but mom stated it was a disaster. Mom has flat nipples that outer edges raise up, center of nipples inverted. Looks like volcano in appearance.  Lt. Nipple w/edema. Reverse pressure slightly helpful. Rt. Nipple compressible. Breast tender. Mom states no increase in size during pregnancy.  LC massaged breast, hand expressed several minutes before a few thick drops of colostrum noted. Collected gave to baby w/spoon. Mom stated w/her 1st child she tried nipple shields and pumping. Mom stated she never got a lot of milk. Could never really latch and feed the baby well.  Mom doesn't want to do a lot of things to over whelm her. Discussed w/mom things that would be helpful to try, if any of theses things gets over whelming to let staff know. Mom agreed.  Mom has shells. Strongly encouraged mom to wear shells in am.  Encouraged mom to pre-pump prior to latching as well as reverse pressure if needed to soften areola and nipple.  Discussed using DEBP. At first mom stated no she didn't want to then mom stated she would try to use it.  Mom told LC at first that she may only BF for a couple of days that when she goes home she plans to formula feeds, she wants the baby to get the colostrum. Then mom tells LC that if BF goes well then she will BF when she goes home. Mom stated she wasn't going to insert anything into a NS or  use SNS. LC encouraged mom to let Samantha Schmidt know her limits so mom doesn't get overwhelmed and frustrated.  Mom knows to pre-pump prior to latching. Wear shells between feedings. BF STS in football position. Call for assistance if having trouble latching. Post pump w/DEBP. Hand express spoon feed any colostrum.  Mom will need to be f/u today when baby is showing more interest in feeding. Lactation brochure at bedside.  Patient Name: Samantha Schmidt [name] Date: 12/07/2018 Reason for consult: Initial assessment   Maternal Data Has patient been taught Hand Expression?: Yes Does the patient have breastfeeding experience prior to this delivery?: Yes(tried at hospital, unable to BF.)  Feeding Feeding Type: Breast Milk  LATCH Score Latch: Too sleepy or reluctant, no latch achieved, no sucking elicited.  Audible Swallowing: None  Type of Nipple: Inverted(inverted centers)  Comfort (Breast/Nipple): Soft / non-tender        Interventions Interventions: Breast feeding basics reviewed;Breast massage;Hand express;Expressed milk;Breast compression;Hand pump;Shells;Reverse pressure;Pre-pump if needed;Position options;Support pillows  Lactation Tools Discussed/Used Tools: Shells;Pump Shell Type: Inverted Breast pump type: Manual(kit in rm) Pump Review: Setup, frequency, and cleaning;Milk Storage Initiated by:: RN Date initiated:: 12/06/18(hand pump)   Consult Status Consult Status: Follow-up Date: 12/07/18 Follow-up type: In-patient    Samantha Schmidt, Samantha Schmidt 12/07/2018, 4:30 AM

## 2018-12-08 ENCOUNTER — Encounter (HOSPITAL_COMMUNITY): Payer: Self-pay | Admitting: *Deleted

## 2018-12-08 MED ORDER — IBUPROFEN 800 MG PO TABS: 800.0000 mg | ORAL_TABLET | Freq: Three times a day (TID) | ORAL | 1 refills | Status: AC

## 2018-12-08 MED ORDER — POLYSACCHARIDE IRON COMPLEX 150 MG PO CAPS: 150.0000 mg | ORAL_CAPSULE | Freq: Every day | ORAL | 1 refills | Status: DC

## 2018-12-08 MED ORDER — SENNOSIDES-DOCUSATE SODIUM 8.6-50 MG PO TABS: 2.0000 | ORAL_TABLET | ORAL | 1 refills | Status: DC

## 2018-12-08 MED ORDER — OXYCODONE-ACETAMINOPHEN 5-325 MG PO TABS: 1.0000 | ORAL_TABLET | ORAL | 0 refills | Status: DC | PRN

## 2018-12-08 NOTE — Discharge Summary (Signed)
OB Discharge Summary  Patient Name: Samantha CaoStephanie Schmidt DOB: 06/26/1981 MRN: [id number]  Date of admission: 12/06/2018 Delivering MD: Noland FordyceFOGLEMAN, Alleyne Lac   Date of discharge: 12/08/2018  Admitting diagnosis: Previous Cesarean Section Intrauterine pregnancy: 6069w2d     Secondary diagnosis:Active Problems:   Status post repeat low transverse cesarean section   Previous cesarean section  Additional problems:anemia     Discharge diagnosis: Term Pregnancy Delivered                                                                     Post partum procedures:none  Augmentation: RCS  Complications: None  Hospital course:  Sceduled C/S   38 y.o. yo G2P1001 at 2369w2d was admitted to the hospital 12/06/2018 for scheduled cesarean section with the following indication:Elective Repeat.  Membrane Rupture Time/Date: 2:03 PM ,12/06/2018   Patient delivered a Viable infant.12/06/2018  Details of operation can be found in separate operative note.  Pateint had an uncomplicated postpartum course.  She is ambulating, tolerating a regular diet, passing flatus, and urinating well. Patient is discharged home in stable condition on  12/08/18        On day of d/c pt notes pain better controlled with Percocet but had only been offered 1 dose  Last night, sub optimal control on motrin/ tylenol. Pt needed percocet with last c/s. Otherwise ready for d/c. Tolerating regular po, +flatus, no CP, no SOB, minimal lochia.  Ambulating and voiding well.   Physical exam  Vitals:   12/07/18 0803 12/07/18 1150 12/07/18 2205 12/08/18 0520  BP: (!) 95/56 126/66 124/72 122/82  Pulse: (!) 59 70 60 62  Resp: 16  18 16   Temp: 97.6 F (36.4 C) 97.7 F (36.5 C) 97.9 F (36.6 C) 97.6 F (36.4 C)  TempSrc:  Oral Oral Oral  SpO2: 99%  99% 97%  Weight:      Height:       General: alert, cooperative and no distress Lochia: appropriate Uterine Fundus: firm Incision: Healing well with no significant drainage DVT Evaluation: No  evidence of DVT seen on physical exam. Labs: Lab Results  Component Value Date   WBC 8.3 12/07/2018   HGB 9.8 (L) 12/07/2018   HCT 28.3 (L) 12/07/2018   MCV 87.6 12/07/2018   PLT 220 12/07/2018   CMP Latest Ref Rng & Units 03/17/2017  Glucose 65 - 99 mg/dL 409(W115(H)  BUN 6 - 20 mg/dL 12  Creatinine 1.190.44 - 1.471.00 mg/dL 8.290.65  Sodium 562135 - 130145 mmol/L 134(L)  Potassium 3.5 - 5.1 mmol/L 4.0  Chloride 101 - 111 mmol/L 105  CO2 22 - 32 mmol/L 20(L)  Calcium 8.9 - 10.3 mg/dL 9.1  Total Protein 6.5 - 8.1 g/dL 6.7  Total Bilirubin 0.3 - 1.2 mg/dL 0.4  Alkaline Phos 38 - 126 U/L 195(H)  AST 15 - 41 U/L 21  ALT 14 - 54 U/L 12(L)    Discharge instruction: per After Visit Summary and "Baby and Me Booklet".  After Visit Meds:  Allergies as of 12/08/2018   No Known Allergies     Medication List    TAKE these medications   calcium carbonate 500 MG chewable tablet Commonly known as:  TUMS - dosed in mg elemental calcium Chew  2 tablets by mouth 3 (three) times daily as needed for indigestion or heartburn.   famotidine 20 MG tablet Commonly known as:  PEPCID Take 20 mg by mouth daily.   ibuprofen 800 MG tablet Commonly known as:  ADVIL Take 1 tablet (800 mg total) by mouth every 8 (eight) hours.   iron polysaccharides 150 MG capsule Commonly known as:  NIFEREX Take 1 capsule (150 mg total) by mouth daily.   nitrofurantoin (macrocrystal-monohydrate) 100 MG capsule Commonly known as:  MACROBID Take 100 mg by mouth 2 (two) times daily.   oxyCODONE-acetaminophen 5-325 MG tablet Commonly known as:  PERCOCET/ROXICET Take 1-2 tablets by mouth every 4 (four) hours as needed for moderate pain.   prenatal multivitamin Tabs tablet Take 1 tablet by mouth daily at 12 noon.   senna-docusate 8.6-50 MG tablet Commonly known as:  Senokot-S Take 2 tablets by mouth daily. Start taking on:  Dec 09, 2018       Diet: routine diet  Activity: Advance as tolerated. Pelvic rest for 6 weeks.    Outpatient follow up:6 weeks Follow up Appt:No future appointments. Follow up visit: No follow-ups on file.  Postpartum contraception: Not Discussed  Newborn Data: Live born female  Birth Weight: 9 lb 5 oz (4225 g) APGAR: 8, 9  Newborn Delivery   Birth date/time:  12/06/2018 14:04:00 Delivery type:  C-Section, Low Transverse Trial of labor:  No C-section categorization:  Repeat     Baby Feeding: Breast Disposition:home with mother   12/08/2018 Lendon Colonel, MD

## 2018-12-24 DIAGNOSIS — G4733 Obstructive sleep apnea (adult) (pediatric): Secondary | ICD-10-CM | POA: Diagnosis not present

## 2019-02-18 DIAGNOSIS — Z713 Dietary counseling and surveillance: Secondary | ICD-10-CM | POA: Diagnosis not present

## 2019-03-11 DIAGNOSIS — Z3202 Encounter for pregnancy test, result negative: Secondary | ICD-10-CM | POA: Diagnosis not present

## 2019-03-11 DIAGNOSIS — Z30430 Encounter for insertion of intrauterine contraceptive device: Secondary | ICD-10-CM | POA: Diagnosis not present

## 2019-03-17 DIAGNOSIS — Z713 Dietary counseling and surveillance: Secondary | ICD-10-CM | POA: Diagnosis not present

## 2019-03-21 ENCOUNTER — Encounter: Payer: Self-pay | Admitting: Family Medicine

## 2019-03-26 DIAGNOSIS — L858 Other specified epidermal thickening: Secondary | ICD-10-CM | POA: Diagnosis not present

## 2019-03-26 DIAGNOSIS — D224 Melanocytic nevi of scalp and neck: Secondary | ICD-10-CM | POA: Diagnosis not present

## 2019-03-26 DIAGNOSIS — D2262 Melanocytic nevi of left upper limb, including shoulder: Secondary | ICD-10-CM | POA: Diagnosis not present

## 2019-03-26 DIAGNOSIS — D225 Melanocytic nevi of trunk: Secondary | ICD-10-CM | POA: Diagnosis not present

## 2019-04-09 DIAGNOSIS — Z30431 Encounter for routine checking of intrauterine contraceptive device: Secondary | ICD-10-CM | POA: Diagnosis not present

## 2019-04-18 DIAGNOSIS — G4733 Obstructive sleep apnea (adult) (pediatric): Secondary | ICD-10-CM | POA: Diagnosis not present

## 2019-04-29 ENCOUNTER — Ambulatory Visit
Admission: RE | Admit: 2019-04-29 | Discharge: 2019-04-29 | Disposition: A | Discharge status: Home / Self Care | Payer: Federal, State, Local not specified - PPO | Source: Ambulatory Visit | Attending: Family Medicine | Admitting: Family Medicine

## 2019-04-29 ENCOUNTER — Other Ambulatory Visit: Payer: Self-pay

## 2019-04-29 ENCOUNTER — Ambulatory Visit: Payer: Federal, State, Local not specified - PPO | Admitting: Family Medicine

## 2019-04-29 ENCOUNTER — Encounter: Payer: Self-pay | Admitting: Family Medicine

## 2019-04-29 VITALS — BP 120/80 | HR 60 | Temp 98.7°F | Wt 203.6 lb

## 2019-04-29 DIAGNOSIS — G4733 Obstructive sleep apnea (adult) (pediatric): Secondary | ICD-10-CM | POA: Diagnosis not present

## 2019-04-29 DIAGNOSIS — E785 Hyperlipidemia, unspecified: Secondary | ICD-10-CM

## 2019-04-29 DIAGNOSIS — Z23 Encounter for immunization: Secondary | ICD-10-CM

## 2019-04-29 DIAGNOSIS — Z9989 Dependence on other enabling machines and devices: Secondary | ICD-10-CM

## 2019-04-29 DIAGNOSIS — G8929 Other chronic pain: Secondary | ICD-10-CM | POA: Diagnosis not present

## 2019-04-29 DIAGNOSIS — Z0000 Encounter for general adult medical examination without abnormal findings: Secondary | ICD-10-CM

## 2019-04-29 DIAGNOSIS — M546 Pain in thoracic spine: Secondary | ICD-10-CM

## 2019-04-29 DIAGNOSIS — R7401 Elevation of levels of liver transaminase levels: Secondary | ICD-10-CM | POA: Diagnosis not present

## 2019-04-29 NOTE — Progress Notes (Signed)
Subjective:    Patient ID: Samantha Schmidt, female    DOB: 1981-03-23, 38 y.o.   MRN: [id number]  HPI Chief Complaint  Patient presents with  . new pt    new pt, cpe, back pain right side. flu shot, sees obgyn   She is new to the practice and here for a complete physical exam. Previous medical care: [facility] [place]. Dr. Hyman Hopes  Last CPE: summer 2019   Other providers: Dr. Ernestina Penna- OB/GYN Dr. Yetta Barre dentist  [facility]  Dr. Sharlot Gowda for eyes.   OSA with CPAP. Diagnosed 2 years ago. [facility] manages this for her.   Right midline thoracic back pain x 6 years that occasionally radiates around her right side.  Picking up her daughter sets off her pain and certain movements. States once it is flared up, pain can be difficult to manage x a week or so. She also goes for long periods without any flare ups if not weight lifting or strenuous exertion. Denies ever having imaging for this problem.  Denies unexplained weight loss, fatigue, cough.   Enlarged anterior cervical lymph nodes   Social history: Lives with her husband and 2 kids, works for [employer]  Former light smoker in college only. Denies drinking alcohol, drug use  Diet: fairly healthy but eats sweets and carbohydrates  Excerise: walks a few times per week  Immunizations: Tdap- in the past year. Flu shot today   Health maintenance:  Mammogram: never  Colonoscopy: never  Last Gynecological Exam: recent birth  Last Menstrual cycle: IUD in August.  Last Dental Exam: current  Last Eye Exam: current   Wears seatbelt always, smoke detectors in home and functioning, does not text while driving and feels safe in home environment.   Reviewed allergies, medications, past medical, surgical, family, and social history.    Review of Systems Review of Systems Constitutional: -fever, -chills, -sweats, -unexpected weight change,-fatigue ENT: -runny nose, -ear pain, -sore throat  Cardiology:  -chest pain, -palpitations, -edema Respiratory: -cough, -shortness of breath, -wheezing Gastroenterology: -abdominal pain, -nausea, -vomiting, -diarrhea, -constipation  Hematology: -bleeding or bruising problems Musculoskeletal: -arthralgias, -myalgias, -joint swelling, +back pain Ophthalmology: -vision changes Urology: -dysuria, -difficulty urinating, -hematuria, -urinary frequency, -urgency Neurology: -headache, -weakness, -tingling, -numbness       Objective:   Physical Exam BP 120/80   Pulse 60   Temp 98.7 F (37.1 C)   Wt 203 lb 9.6 oz (92.4 kg)   Breastfeeding No   BMI 30.07 kg/m   General Appearance:    Alert, cooperative, no distress, appears stated age  Head:    Normocephalic, without obvious abnormality, atraumatic  Eyes:    PERRL, conjunctiva/corneas clear, EOM's intact, fundi    benign  Ears:    Normal TM's and external ear canals  Nose:   Mask in place   Throat:   Mask in place   Neck:   Supple, no lymphadenopathy;  thyroid:  no   enlargement/tenderness/nodules; no carotid   bruit or JVD  Back:    Spine nontender, no curvature, ROM normal, no CVA     Tenderness. Unable to reproduce her thoracic pain on exam  Lungs:     Clear to auscultation bilaterally without wheezes, rales or     ronchi; respirations unlabored  Chest Wall:    No tenderness or deformity   Heart:    Regular rate and rhythm, S1 and S2 normal, no murmur, rub   or gallop  Breast Exam:  OB/GYN  Abdomen:     Soft, non-tender, nondistended, normoactive bowel sounds,    no masses, no hepatosplenomegaly  Genitalia:    OB/GYN  Rectal:    Not performed due to age<40 and no related complaints  Extremities:   No clubbing, cyanosis or edema  Pulses:   2+ and symmetric all extremities  Skin:   Skin color, texture, turgor normal, no rashes or lesions  Lymph nodes:  Enlargement of submandibular lymph nodes (chronic per patient) nontender, supraclavicular, and axillary nodes normal   Neurologic:   CNII-XII intact, normal strength, sensation and gait; reflexes 2+ and symmetric throughout          Psych:   Normal mood, affect, hygiene and grooming.         Assessment & Plan:  Routine general medical examination at a health care facility - Plan: CBC with Differential/Platelet, Comprehensive metabolic panel, TSH, T4, free, Lipid panel -Here today for a CPE.  She is new to the practice.  Discussed preventive healthcare.  She is followed by her OB/GYN.  Counseling on healthy diet and exercise for overall health.  Discussed safety.  Reviewed immunizations.  Hyperlipidemia, unspecified hyperlipidemia type - Plan: Lipid panel -History of elevated LDL and triglycerides in the past.  Counseling on low-fat, low-cholesterol diet.  Check lipids and follow-up.  Chronic right-sided thoracic back pain - Plan: DG Thoracic Spine W/Swimmers, Ambulatory referral to Physical Therapy -No red flag symptoms.  This pain is been intermittent for the past 6 years and no imaging has been done.  I will send her for thoracic spine x-ray and refer to PT.  Offered referral to orthopedist and we will hold off for now.  OSA on CPAP-this is managed by [facility]  Needs flu shot

## 2019-04-29 NOTE — Patient Instructions (Signed)
It was a pleasure meeting you today.  Thank you for trusting [place] with your health care.  You can go to [facility] imaging for the x-ray of your thoracic spine.  I am referring you to physical therapy and they will contact you.  This should be with [facility].  Try to eat a low-fat, low-cholesterol diet.  Limit fried foods, creamy sauces and creamy dressings.  Avoid meat some meals completely.  It is recommended that he get at least 150 minutes of vigorous physical activity weekly.  We will contact you with your lab results.    Preventive Care 75-39 Years Old, Female Preventive care refers to visits with your health care provider and lifestyle choices that can promote health and wellness. This includes:  A yearly physical exam. This may also be called an annual well check.  Regular dental visits and eye exams.  Immunizations.  Screening for certain conditions.  Healthy lifestyle choices, such as eating a healthy diet, getting regular exercise, not using drugs or products that contain nicotine and tobacco, and limiting alcohol use. What can I expect for my preventive care visit? Physical exam Your health care provider will check your:  Height and weight. This may be used to calculate body mass index (BMI), which tells if you are at a healthy weight.  Heart rate and blood pressure.  Skin for abnormal spots. Counseling Your health care provider may ask you questions about your:  Alcohol, tobacco, and drug use.  Emotional well-being.  Home and relationship well-being.  Sexual activity.  Eating habits.  Work and work Statistician.  Method of birth control.  Menstrual cycle.  Pregnancy history. What immunizations do I need?  Influenza (flu) vaccine  This is recommended every year. Tetanus, diphtheria, and pertussis (Tdap) vaccine  You may need a Td booster every 10 years. Varicella (chickenpox) vaccine  You may need this if you have not been  vaccinated. Human papillomavirus (HPV) vaccine  If recommended by your health care provider, you may need three doses over 6 months. Measles, mumps, and rubella (MMR) vaccine  You may need at least one dose of MMR. You may also need a second dose. Meningococcal conjugate (MenACWY) vaccine  One dose is recommended if you are age 63-21 years and a first-year college student living in a residence hall, or if you have one of several medical conditions. You may also need additional booster doses. Pneumococcal conjugate (PCV13) vaccine  You may need this if you have certain conditions and were not previously vaccinated. Pneumococcal polysaccharide (PPSV23) vaccine  You may need one or two doses if you smoke cigarettes or if you have certain conditions. Hepatitis A vaccine  You may need this if you have certain conditions or if you travel or work in places where you may be exposed to hepatitis A. Hepatitis B vaccine  You may need this if you have certain conditions or if you travel or work in places where you may be exposed to hepatitis B. Haemophilus influenzae type b (Hib) vaccine  You may need this if you have certain conditions. You may receive vaccines as individual doses or as more than one vaccine together in one shot (combination vaccines). Talk with your health care provider about the risks and benefits of combination vaccines. What tests do I need?  Blood tests  Lipid and cholesterol levels. These may be checked every 5 years starting at age 39.  Hepatitis C test.  Hepatitis B test. Screening  Diabetes screening. This is  done by checking your blood sugar (glucose) after you have not eaten for a while (fasting).  Sexually transmitted disease (STD) testing.  BRCA-related cancer screening. This may be done if you have a family history of breast, ovarian, tubal, or peritoneal cancers.  Pelvic exam and Pap test. This may be done every 3 years starting at age 30. Starting at  age 39, this may be done every 5 years if you have a Pap test in combination with an HPV test. Talk with your health care provider about your test results, treatment options, and if necessary, the need for more tests. Follow these instructions at home: Eating and drinking   Eat a diet that includes fresh fruits and vegetables, whole grains, lean protein, and low-fat dairy.  Take vitamin and mineral supplements as recommended by your health care provider.  Do not drink alcohol if: ? Your health care provider tells you not to drink. ? You are pregnant, may be pregnant, or are planning to become pregnant.  If you drink alcohol: ? Limit how much you have to 0-1 drink a day. ? Be aware of how much alcohol is in your drink. In the [place] one drink equals one 12 oz bottle of beer (355 mL), one 5 oz glass of wine (148 mL), or one 1 oz glass of hard liquor (44 mL). Lifestyle  Take daily care of your teeth and gums.  Stay active. Exercise for at least 30 minutes on 5 or more days each week.  Do not use any products that contain nicotine or tobacco, such as cigarettes, e-cigarettes, and chewing tobacco. If you need help quitting, ask your health care provider.  If you are sexually active, practice safe sex. Use a condom or other form of birth control (contraception) in order to prevent pregnancy and STIs (sexually transmitted infections). If you plan to become pregnant, see your health care provider for a preconception visit. What's next?  Visit your health care provider once a year for a well check visit.  Ask your health care provider how often you should have your eyes and teeth checked.  Stay up to date on all vaccines. This information is not intended to replace advice given to you by your health care provider. Make sure you discuss any questions you have with your health care provider. Document Released: 08/29/2001 Document Revised: 03/14/2018 Document Reviewed: 03/14/2018 Elsevier  Patient Education  2020 [employer].

## 2019-04-29 NOTE — Addendum Note (Signed)
Addended by: Minette Headland A on: 04/29/2019 04:12 PM   Modules accepted: Orders

## 2019-04-30 LAB — COMPREHENSIVE METABOLIC PANEL
ALT: 43 IU/L — ABNORMAL HIGH (ref 0–32)
AST: 31 IU/L (ref 0–40)
Albumin/Globulin Ratio: 1.6 (ref 1.2–2.2)
Albumin: 4.6 g/dL (ref 3.8–4.8)
Alkaline Phosphatase: 63 IU/L (ref 39–117)
BUN/Creatinine Ratio: 18 (ref 9–23)
BUN: 13 mg/dL (ref 6–20)
Bilirubin Total: 0.3 mg/dL (ref 0.0–1.2)
CO2: 20 mmol/L (ref 20–29)
Calcium: 9.6 mg/dL (ref 8.7–10.2)
Chloride: 100 mmol/L (ref 96–106)
Creatinine, Ser: 0.71 mg/dL (ref 0.57–1.00)
GFR calc Af Amer: 124 mL/min/{1.73_m2} (ref >59)
GFR calc non Af Amer: 108 mL/min/{1.73_m2} (ref >59)
Globulin, Total: 2.8 g/dL (ref 1.5–4.5)
Glucose: 91 mg/dL (ref 65–99)
Potassium: 4.5 mmol/L (ref 3.5–5.2)
Sodium: 140 mmol/L (ref 134–144)
Total Protein: 7.4 g/dL (ref 6.0–8.5)

## 2019-04-30 LAB — CBC WITH DIFFERENTIAL/PLATELET
Basophils Absolute: 0.1 10*3/uL (ref 0.0–0.2)
Basos: 1 %
EOS (ABSOLUTE): 0.2 10*3/uL (ref 0.0–0.4)
Eos: 3 %
Hematocrit: 42.1 % (ref 34.0–46.6)
Hemoglobin: 14.4 g/dL (ref 11.1–15.9)
Immature Grans (Abs): 0 10*3/uL (ref 0.0–0.1)
Immature Granulocytes: 0 %
Lymphocytes Absolute: 2.5 10*3/uL (ref 0.7–3.1)
Lymphs: 42 %
MCH: 30.4 pg (ref 26.6–33.0)
MCHC: 34.2 g/dL (ref 31.5–35.7)
MCV: 89 fL (ref 79–97)
Monocytes Absolute: 0.5 10*3/uL (ref 0.1–0.9)
Monocytes: 8 %
Neutrophils Absolute: 2.8 10*3/uL (ref 1.4–7.0)
Neutrophils: 46 %
Platelets: 282 10*3/uL (ref 150–450)
RBC: 4.74 x10E6/uL (ref 3.77–5.28)
RDW: 13.1 % (ref 11.7–15.4)
WBC: 6 10*3/uL (ref 3.4–10.8)

## 2019-04-30 LAB — TSH: TSH: 1.49 u[IU]/mL (ref 0.450–4.500)

## 2019-04-30 LAB — LIPID PANEL
Chol/HDL Ratio: 4.8 ratio — ABNORMAL HIGH (ref 0.0–4.4)
Cholesterol, Total: 252 mg/dL — ABNORMAL HIGH (ref 100–199)
HDL: 52 mg/dL (ref >39)
LDL Chol Calc (NIH): 165 mg/dL — ABNORMAL HIGH (ref 0–99)
Triglycerides: 193 mg/dL — ABNORMAL HIGH (ref 0–149)
VLDL Cholesterol Cal: 35 mg/dL (ref 5–40)

## 2019-04-30 LAB — T4, FREE: Free T4: 1.19 ng/dL (ref 0.82–1.77)

## 2019-05-02 LAB — HEPATITIS PANEL, ACUTE
Hep A IgM: NEGATIVE
Hep B C IgM: NEGATIVE
Hep C Virus Ab: <0.1 s/co ratio (ref 0.0–0.9)
Hepatitis B Surface Ag: NEGATIVE

## 2019-05-02 LAB — SPECIMEN STATUS REPORT

## 2019-05-09 DIAGNOSIS — M546 Pain in thoracic spine: Secondary | ICD-10-CM | POA: Diagnosis not present

## 2019-05-14 DIAGNOSIS — M546 Pain in thoracic spine: Secondary | ICD-10-CM | POA: Diagnosis not present

## 2019-05-16 ENCOUNTER — Encounter: Payer: Self-pay | Admitting: Family Medicine

## 2019-05-16 DIAGNOSIS — R7401 Elevation of levels of liver transaminase levels: Secondary | ICD-10-CM

## 2019-05-16 DIAGNOSIS — M546 Pain in thoracic spine: Secondary | ICD-10-CM | POA: Diagnosis not present

## 2019-05-16 HISTORY — DX: Elevation of levels of liver transaminase levels: R74.01

## 2019-05-19 ENCOUNTER — Telehealth: Payer: Self-pay | Admitting: Family Medicine

## 2019-05-19 NOTE — Telephone Encounter (Signed)
Requested records received from [facility].  

## 2019-05-20 ENCOUNTER — Encounter: Payer: Self-pay | Admitting: Family Medicine

## 2019-05-20 DIAGNOSIS — L719 Rosacea, unspecified: Secondary | ICD-10-CM | POA: Insufficient documentation

## 2019-05-20 DIAGNOSIS — M546 Pain in thoracic spine: Secondary | ICD-10-CM | POA: Diagnosis not present

## 2019-05-20 DIAGNOSIS — B977 Papillomavirus as the cause of diseases classified elsewhere: Secondary | ICD-10-CM

## 2019-05-20 DIAGNOSIS — K219 Gastro-esophageal reflux disease without esophagitis: Secondary | ICD-10-CM | POA: Insufficient documentation

## 2019-05-20 HISTORY — DX: Papillomavirus as the cause of diseases classified elsewhere: B97.7

## 2019-05-23 DIAGNOSIS — M546 Pain in thoracic spine: Secondary | ICD-10-CM | POA: Diagnosis not present

## 2019-05-27 ENCOUNTER — Telehealth: Payer: Self-pay | Admitting: Family Medicine

## 2019-05-27 NOTE — Telephone Encounter (Signed)
Requested records received from [facility]  °

## 2019-05-28 DIAGNOSIS — M546 Pain in thoracic spine: Secondary | ICD-10-CM | POA: Diagnosis not present

## 2019-06-13 DIAGNOSIS — M546 Pain in thoracic spine: Secondary | ICD-10-CM | POA: Diagnosis not present

## 2019-06-17 ENCOUNTER — Other Ambulatory Visit: Payer: Self-pay | Admitting: Family Medicine

## 2019-06-17 ENCOUNTER — Telehealth: Payer: Self-pay | Admitting: Internal Medicine

## 2019-06-17 DIAGNOSIS — R7401 Elevation of levels of liver transaminase levels: Secondary | ICD-10-CM

## 2019-06-17 NOTE — Telephone Encounter (Signed)
Pt has upcoming lab visit please put in future orders

## 2019-06-17 NOTE — Telephone Encounter (Signed)
done

## 2019-06-30 ENCOUNTER — Telehealth: Payer: Self-pay | Admitting: Family Medicine

## 2019-06-30 NOTE — Telephone Encounter (Signed)
Pt is coming in Friday for labs and wants to see if she could be checked for the covid antibodies at that time

## 2019-06-30 NOTE — Telephone Encounter (Signed)
We can order the Covid-19 antibody lab test but I will need to have a diagnosis for this. Has she been sick with a respiratory illness at some point over the past few months? I cannot say whether insurance will pay for this but I am happy to order it for her. Please ask if she would like to proceed and let me know. Thanks.

## 2019-07-01 NOTE — Telephone Encounter (Signed)
Does she still want the antibody blood test?

## 2019-07-01 NOTE — Telephone Encounter (Signed)
Pt has not been sick but just wanted to know. I advised her I did not know if insurance paid for it and if she hasn't been sick then it might not show anything

## 2019-07-02 NOTE — Telephone Encounter (Signed)
No she does not

## 2019-07-04 ENCOUNTER — Other Ambulatory Visit: Payer: Federal, State, Local not specified - PPO

## 2019-07-04 ENCOUNTER — Other Ambulatory Visit: Payer: Self-pay

## 2019-07-04 DIAGNOSIS — R7401 Elevation of levels of liver transaminase levels: Secondary | ICD-10-CM | POA: Diagnosis not present

## 2019-07-05 LAB — COMPREHENSIVE METABOLIC PANEL
ALT: 20 IU/L (ref 0–32)
AST: 14 IU/L (ref 0–40)
Albumin/Globulin Ratio: 2 (ref 1.2–2.2)
Albumin: 4.5 g/dL (ref 3.8–4.8)
Alkaline Phosphatase: 61 IU/L (ref 39–117)
BUN/Creatinine Ratio: 20 (ref 9–23)
BUN: 11 mg/dL (ref 6–20)
Bilirubin Total: 0.3 mg/dL (ref 0.0–1.2)
CO2: 23 mmol/L (ref 20–29)
Calcium: 9.7 mg/dL (ref 8.7–10.2)
Chloride: 103 mmol/L (ref 96–106)
Creatinine, Ser: 0.55 mg/dL — ABNORMAL LOW (ref 0.57–1.00)
GFR calc Af Amer: 137 mL/min/{1.73_m2} (ref >59)
GFR calc non Af Amer: 119 mL/min/{1.73_m2} (ref >59)
Globulin, Total: 2.3 g/dL (ref 1.5–4.5)
Glucose: 92 mg/dL (ref 65–99)
Potassium: 4.6 mmol/L (ref 3.5–5.2)
Sodium: 139 mmol/L (ref 134–144)
Total Protein: 6.8 g/dL (ref 6.0–8.5)

## 2019-08-26 ENCOUNTER — Ambulatory Visit: Payer: Federal, State, Local not specified - PPO | Attending: Internal Medicine

## 2019-08-26 DIAGNOSIS — Z20822 Contact with and (suspected) exposure to covid-19: Secondary | ICD-10-CM | POA: Diagnosis not present

## 2019-08-27 LAB — NOVEL CORONAVIRUS, NAA: SARS-CoV-2, NAA: NOT DETECTED

## 2019-09-09 DIAGNOSIS — F4322 Adjustment disorder with anxiety: Secondary | ICD-10-CM | POA: Diagnosis not present

## 2019-09-22 DIAGNOSIS — Z3200 Encounter for pregnancy test, result unknown: Secondary | ICD-10-CM | POA: Diagnosis not present

## 2019-09-22 DIAGNOSIS — N921 Excessive and frequent menstruation with irregular cycle: Secondary | ICD-10-CM | POA: Diagnosis not present

## 2019-11-18 DIAGNOSIS — G4733 Obstructive sleep apnea (adult) (pediatric): Secondary | ICD-10-CM | POA: Diagnosis not present

## 2019-12-25 DIAGNOSIS — N921 Excessive and frequent menstruation with irregular cycle: Secondary | ICD-10-CM | POA: Diagnosis not present

## 2019-12-25 DIAGNOSIS — Z30432 Encounter for removal of intrauterine contraceptive device: Secondary | ICD-10-CM | POA: Diagnosis not present

## 2020-02-18 DIAGNOSIS — G4733 Obstructive sleep apnea (adult) (pediatric): Secondary | ICD-10-CM | POA: Diagnosis not present

## 2020-02-25 DIAGNOSIS — F4323 Adjustment disorder with mixed anxiety and depressed mood: Secondary | ICD-10-CM | POA: Diagnosis not present

## 2020-03-23 DIAGNOSIS — F4322 Adjustment disorder with anxiety: Secondary | ICD-10-CM | POA: Diagnosis not present

## 2020-04-08 DIAGNOSIS — F4322 Adjustment disorder with anxiety: Secondary | ICD-10-CM | POA: Diagnosis not present

## 2020-04-26 DIAGNOSIS — Z6828 Body mass index (BMI) 28.0-28.9, adult: Secondary | ICD-10-CM | POA: Diagnosis not present

## 2020-04-26 DIAGNOSIS — Z1231 Encounter for screening mammogram for malignant neoplasm of breast: Secondary | ICD-10-CM | POA: Diagnosis not present

## 2020-04-26 DIAGNOSIS — Z01419 Encounter for gynecological examination (general) (routine) without abnormal findings: Secondary | ICD-10-CM | POA: Diagnosis not present

## 2020-04-26 DIAGNOSIS — Z1151 Encounter for screening for human papillomavirus (HPV): Secondary | ICD-10-CM | POA: Diagnosis not present

## 2020-05-07 DIAGNOSIS — F4322 Adjustment disorder with anxiety: Secondary | ICD-10-CM | POA: Diagnosis not present

## 2020-05-18 DIAGNOSIS — G4733 Obstructive sleep apnea (adult) (pediatric): Secondary | ICD-10-CM | POA: Diagnosis not present

## 2020-06-04 DIAGNOSIS — F4322 Adjustment disorder with anxiety: Secondary | ICD-10-CM | POA: Diagnosis not present

## 2020-06-14 DIAGNOSIS — D225 Melanocytic nevi of trunk: Secondary | ICD-10-CM | POA: Diagnosis not present

## 2020-06-14 DIAGNOSIS — D1801 Hemangioma of skin and subcutaneous tissue: Secondary | ICD-10-CM | POA: Diagnosis not present

## 2020-06-14 DIAGNOSIS — L820 Inflamed seborrheic keratosis: Secondary | ICD-10-CM | POA: Diagnosis not present

## 2020-06-14 DIAGNOSIS — L858 Other specified epidermal thickening: Secondary | ICD-10-CM | POA: Diagnosis not present

## 2020-06-14 DIAGNOSIS — L821 Other seborrheic keratosis: Secondary | ICD-10-CM | POA: Diagnosis not present

## 2020-07-22 DIAGNOSIS — H6122 Impacted cerumen, left ear: Secondary | ICD-10-CM | POA: Diagnosis not present

## 2020-07-22 DIAGNOSIS — H938X1 Other specified disorders of right ear: Secondary | ICD-10-CM | POA: Diagnosis not present

## 2020-08-05 DIAGNOSIS — G4733 Obstructive sleep apnea (adult) (pediatric): Secondary | ICD-10-CM | POA: Diagnosis not present

## 2020-11-03 DIAGNOSIS — G4733 Obstructive sleep apnea (adult) (pediatric): Secondary | ICD-10-CM | POA: Diagnosis not present

## 2020-11-04 DIAGNOSIS — F4320 Adjustment disorder, unspecified: Secondary | ICD-10-CM | POA: Diagnosis not present

## 2020-11-18 DIAGNOSIS — F4320 Adjustment disorder, unspecified: Secondary | ICD-10-CM | POA: Diagnosis not present

## 2020-12-08 DIAGNOSIS — F4320 Adjustment disorder, unspecified: Secondary | ICD-10-CM | POA: Diagnosis not present

## 2020-12-16 DIAGNOSIS — F4320 Adjustment disorder, unspecified: Secondary | ICD-10-CM | POA: Diagnosis not present

## 2020-12-27 DIAGNOSIS — F4320 Adjustment disorder, unspecified: Secondary | ICD-10-CM | POA: Diagnosis not present

## 2021-01-13 DIAGNOSIS — F4320 Adjustment disorder, unspecified: Secondary | ICD-10-CM | POA: Diagnosis not present

## 2021-01-27 DIAGNOSIS — F4320 Adjustment disorder, unspecified: Secondary | ICD-10-CM | POA: Diagnosis not present

## 2021-02-18 DIAGNOSIS — G4733 Obstructive sleep apnea (adult) (pediatric): Secondary | ICD-10-CM | POA: Diagnosis not present

## 2021-02-22 DIAGNOSIS — F4320 Adjustment disorder, unspecified: Secondary | ICD-10-CM | POA: Diagnosis not present

## 2021-03-24 DIAGNOSIS — F4320 Adjustment disorder, unspecified: Secondary | ICD-10-CM | POA: Diagnosis not present

## 2021-04-18 DIAGNOSIS — F4320 Adjustment disorder, unspecified: Secondary | ICD-10-CM | POA: Diagnosis not present

## 2021-05-13 DIAGNOSIS — Z0000 Encounter for general adult medical examination without abnormal findings: Secondary | ICD-10-CM | POA: Diagnosis not present

## 2021-05-20 DIAGNOSIS — Z1231 Encounter for screening mammogram for malignant neoplasm of breast: Secondary | ICD-10-CM | POA: Diagnosis not present

## 2021-05-20 DIAGNOSIS — Z6827 Body mass index (BMI) 27.0-27.9, adult: Secondary | ICD-10-CM | POA: Diagnosis not present

## 2021-05-20 DIAGNOSIS — Z01419 Encounter for gynecological examination (general) (routine) without abnormal findings: Secondary | ICD-10-CM | POA: Diagnosis not present

## 2021-05-31 DIAGNOSIS — Z0000 Encounter for general adult medical examination without abnormal findings: Secondary | ICD-10-CM | POA: Diagnosis not present

## 2021-05-31 DIAGNOSIS — Z13220 Encounter for screening for lipoid disorders: Secondary | ICD-10-CM | POA: Diagnosis not present

## 2021-06-15 DIAGNOSIS — D2262 Melanocytic nevi of left upper limb, including shoulder: Secondary | ICD-10-CM | POA: Diagnosis not present

## 2021-06-15 DIAGNOSIS — D2261 Melanocytic nevi of right upper limb, including shoulder: Secondary | ICD-10-CM | POA: Diagnosis not present

## 2021-06-15 DIAGNOSIS — L820 Inflamed seborrheic keratosis: Secondary | ICD-10-CM | POA: Diagnosis not present

## 2021-06-15 DIAGNOSIS — L858 Other specified epidermal thickening: Secondary | ICD-10-CM | POA: Diagnosis not present

## 2021-06-15 DIAGNOSIS — D225 Melanocytic nevi of trunk: Secondary | ICD-10-CM | POA: Diagnosis not present

## 2021-06-16 DIAGNOSIS — F4320 Adjustment disorder, unspecified: Secondary | ICD-10-CM | POA: Diagnosis not present

## 2021-06-20 IMAGING — CR DG THORACIC SPINE 3V
3 series · 3 of 3 positions shown · non-contrast
Comparison: None.

CLINICAL DATA: chronic pain midline and right throacic spine x 6
years. NKI

EXAM:
THORACIC SPINE - 3 VIEWS

[t thoracic spine ap]
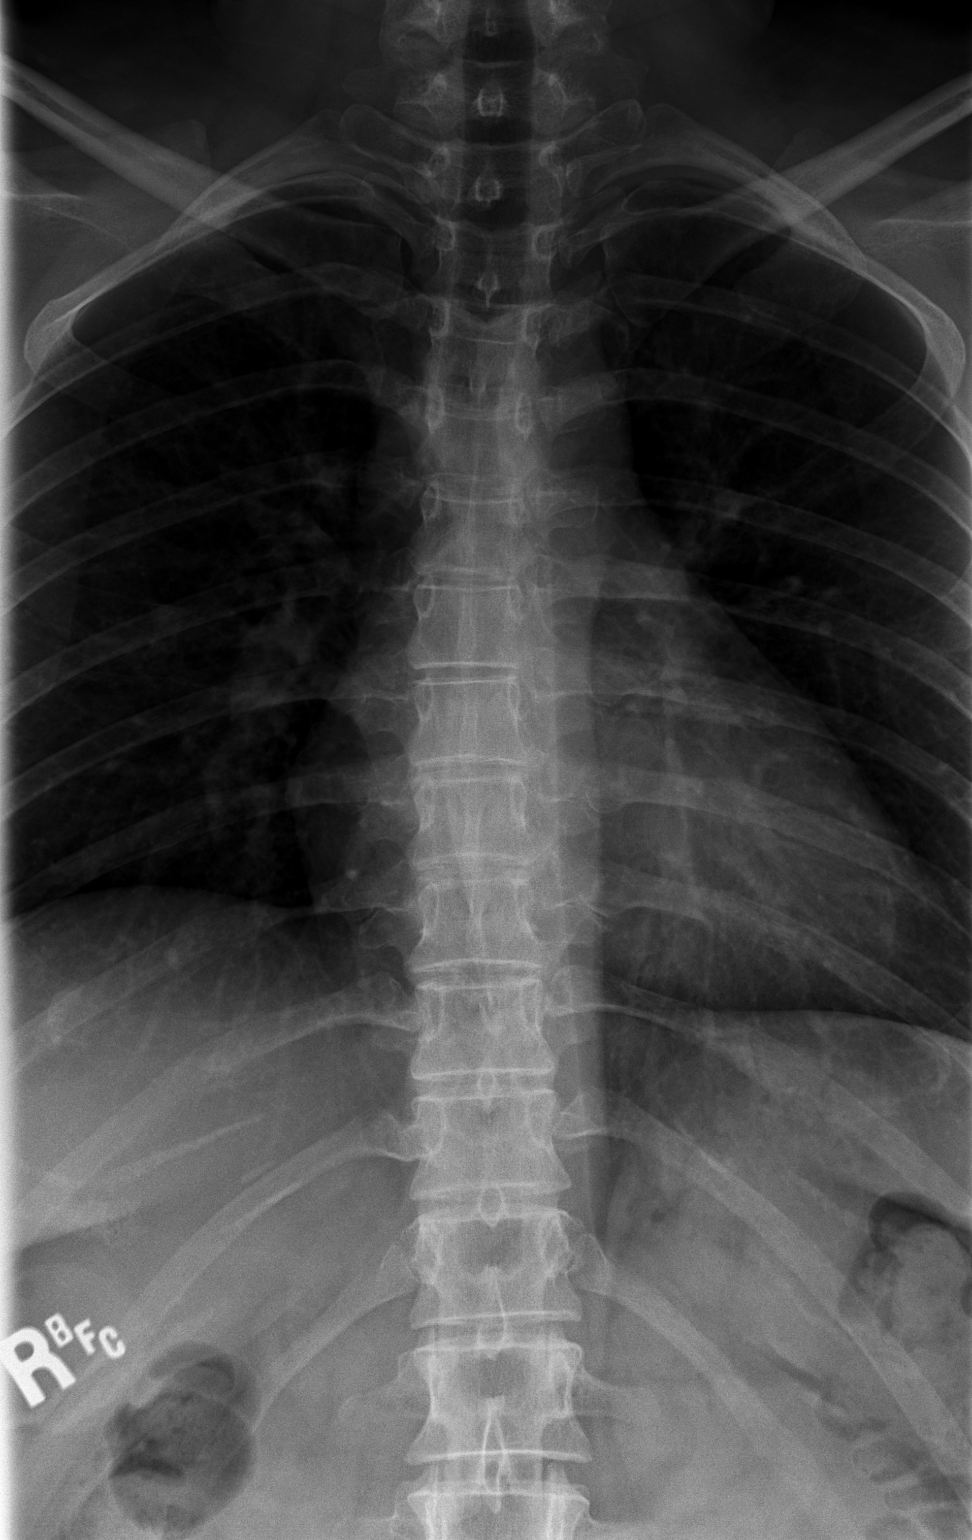

[t thoracic spine lat]
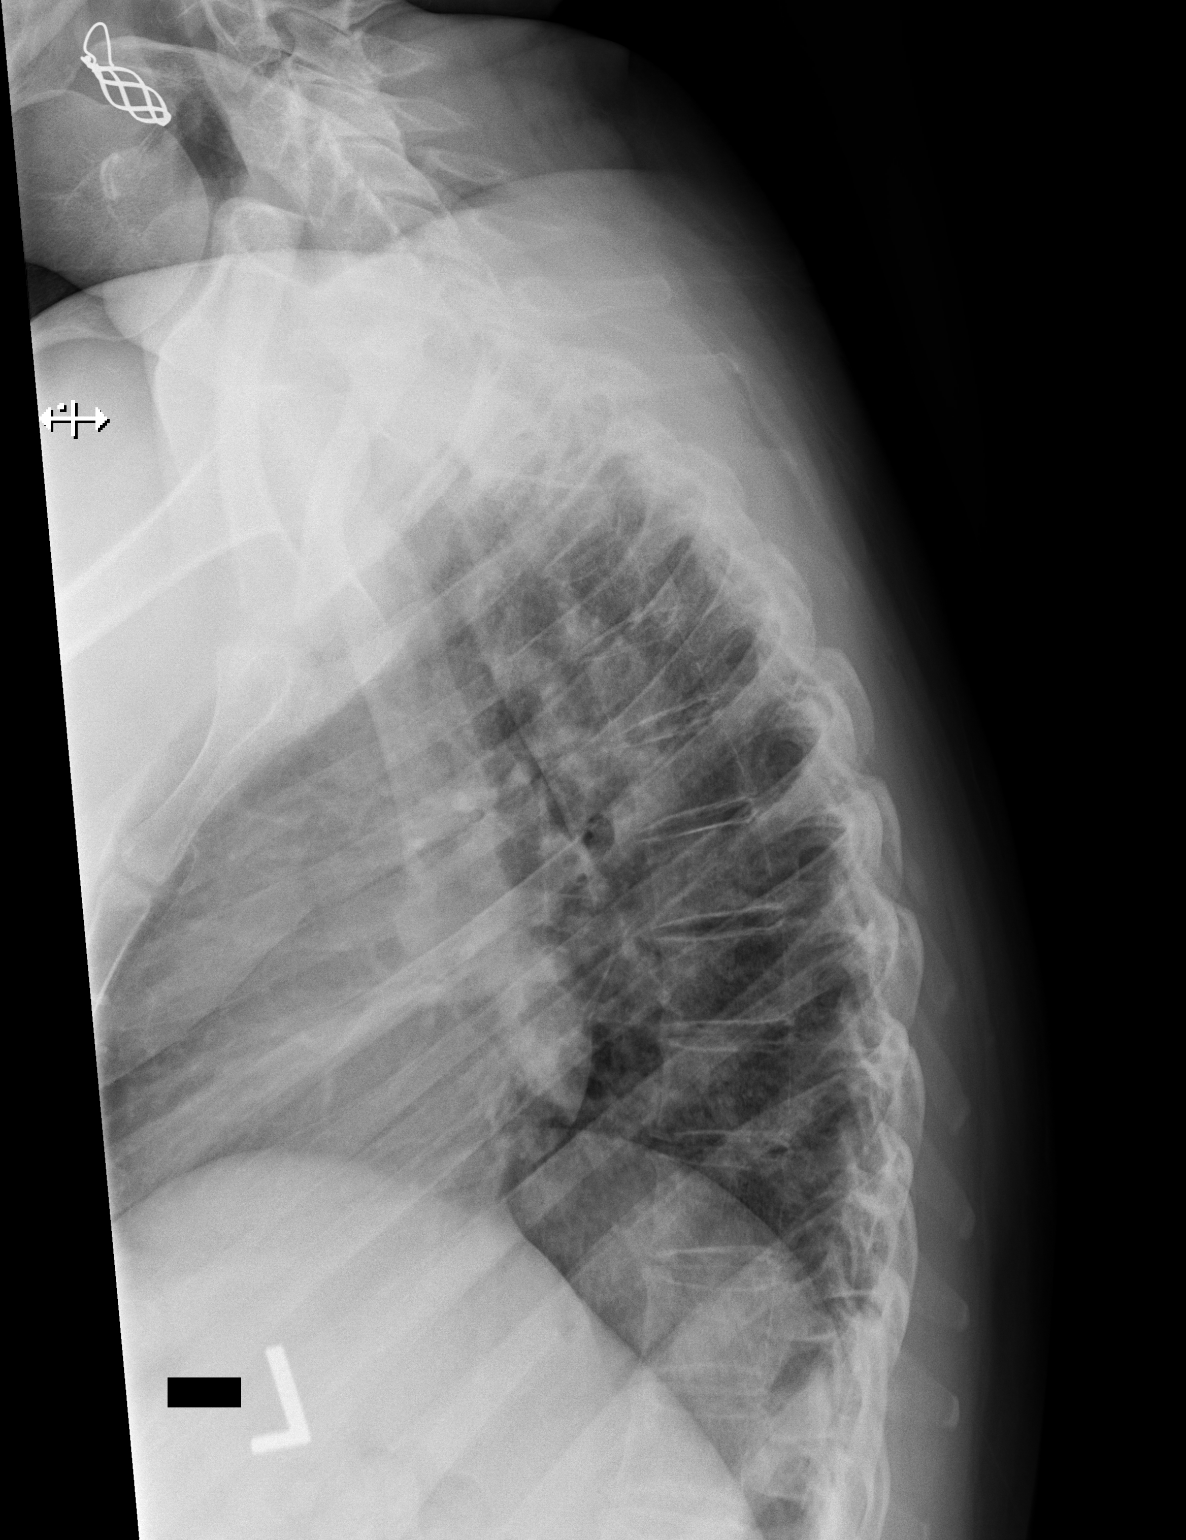

[t thoracic swimmers]
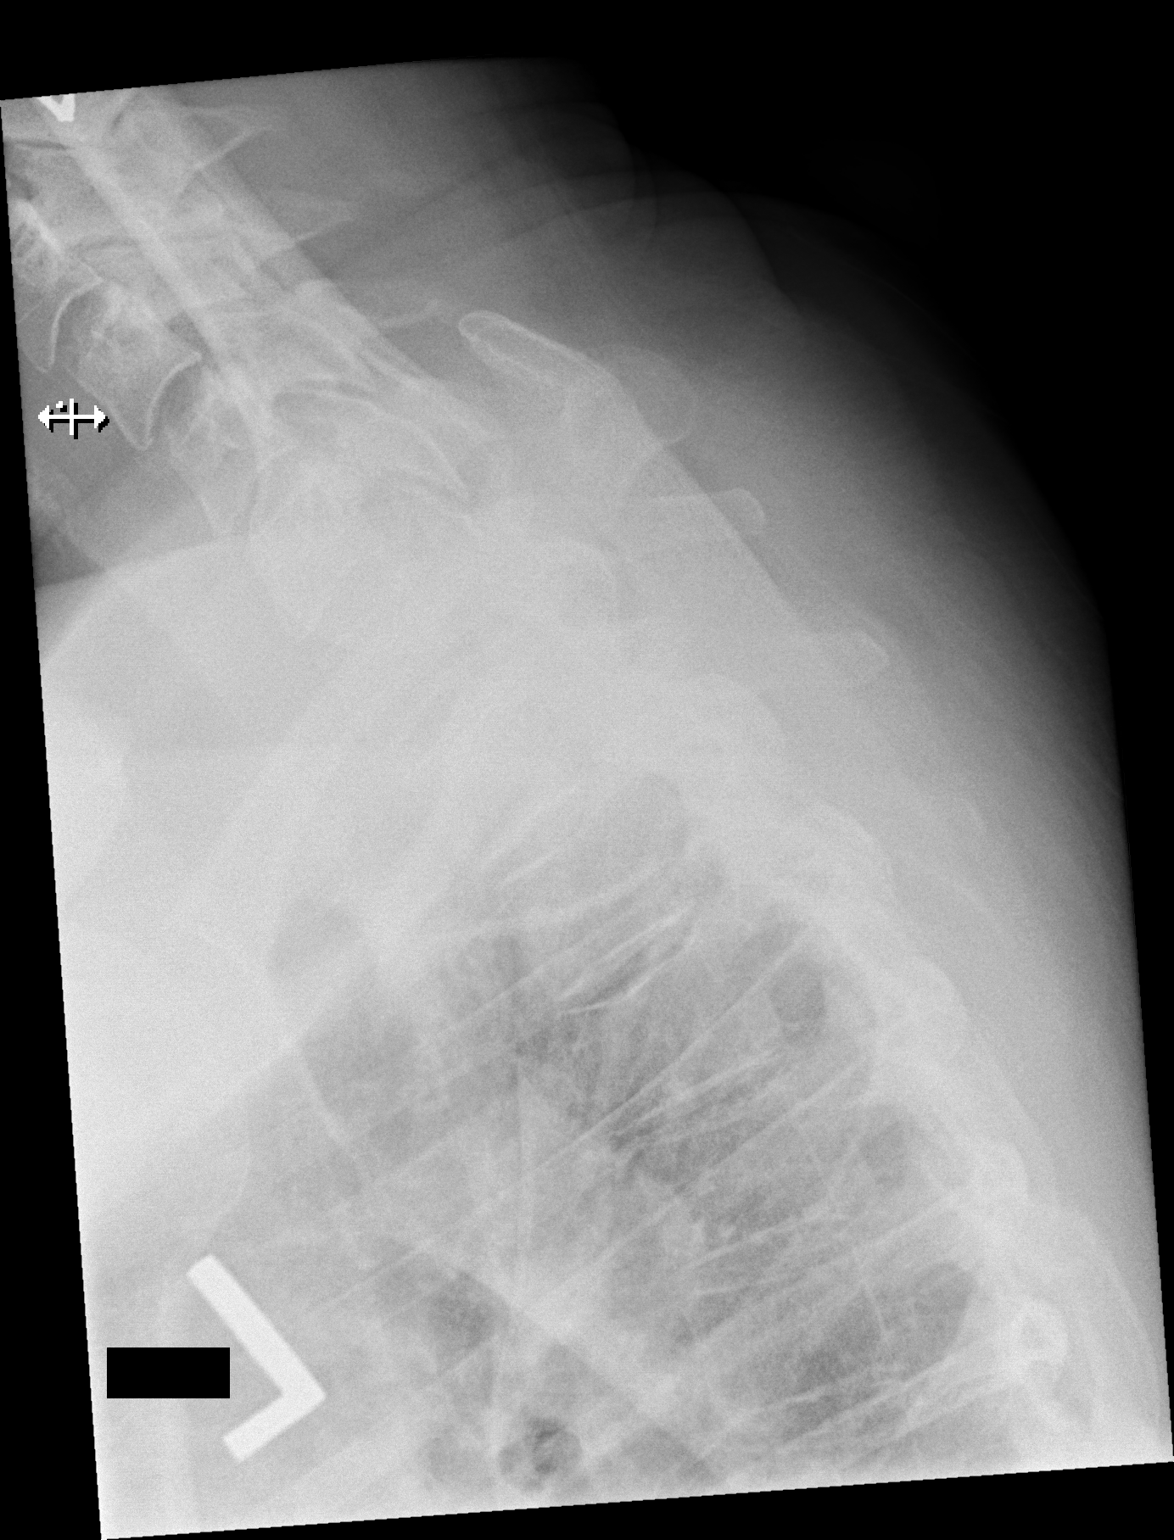

[3 of 3 positions shown; findings below may reference images not displayed]

FINDINGS: There is no evidence of thoracic spine fracture. Alignment is
normal. No other significant bone abnormalities are identified.
Visualized portions of the ribs and lung are unremarkable.
IMPRESSION: Negative thoracic spine radiographs.

## 2021-06-28 DIAGNOSIS — G4733 Obstructive sleep apnea (adult) (pediatric): Secondary | ICD-10-CM | POA: Diagnosis not present

## 2021-06-30 DIAGNOSIS — F4320 Adjustment disorder, unspecified: Secondary | ICD-10-CM | POA: Diagnosis not present

## 2021-07-01 ENCOUNTER — Encounter: Payer: Federal, State, Local not specified - PPO | Admitting: Family Medicine

## 2021-07-28 DIAGNOSIS — F4320 Adjustment disorder, unspecified: Secondary | ICD-10-CM | POA: Diagnosis not present

## 2021-07-29 DIAGNOSIS — G4733 Obstructive sleep apnea (adult) (pediatric): Secondary | ICD-10-CM | POA: Diagnosis not present

## 2021-08-11 DIAGNOSIS — F4320 Adjustment disorder, unspecified: Secondary | ICD-10-CM | POA: Diagnosis not present

## 2021-08-29 DIAGNOSIS — J029 Acute pharyngitis, unspecified: Secondary | ICD-10-CM | POA: Diagnosis not present

## 2021-08-29 DIAGNOSIS — G4733 Obstructive sleep apnea (adult) (pediatric): Secondary | ICD-10-CM | POA: Diagnosis not present

## 2021-09-08 DIAGNOSIS — F4320 Adjustment disorder, unspecified: Secondary | ICD-10-CM | POA: Diagnosis not present

## 2021-10-06 DIAGNOSIS — F4320 Adjustment disorder, unspecified: Secondary | ICD-10-CM | POA: Diagnosis not present

## 2021-11-03 DIAGNOSIS — F4320 Adjustment disorder, unspecified: Secondary | ICD-10-CM | POA: Diagnosis not present

## 2021-11-07 DIAGNOSIS — J039 Acute tonsillitis, unspecified: Secondary | ICD-10-CM | POA: Diagnosis not present

## 2021-12-19 DIAGNOSIS — G4733 Obstructive sleep apnea (adult) (pediatric): Secondary | ICD-10-CM | POA: Diagnosis not present

## 2021-12-29 DIAGNOSIS — F4320 Adjustment disorder, unspecified: Secondary | ICD-10-CM | POA: Diagnosis not present

## 2022-01-12 DIAGNOSIS — F4320 Adjustment disorder, unspecified: Secondary | ICD-10-CM | POA: Diagnosis not present

## 2022-01-18 DIAGNOSIS — G4733 Obstructive sleep apnea (adult) (pediatric): Secondary | ICD-10-CM | POA: Diagnosis not present

## 2022-02-09 DIAGNOSIS — F4320 Adjustment disorder, unspecified: Secondary | ICD-10-CM | POA: Diagnosis not present

## 2022-02-14 DIAGNOSIS — F411 Generalized anxiety disorder: Secondary | ICD-10-CM | POA: Diagnosis not present

## 2022-02-14 DIAGNOSIS — R030 Elevated blood-pressure reading, without diagnosis of hypertension: Secondary | ICD-10-CM | POA: Diagnosis not present

## 2022-02-14 DIAGNOSIS — F410 Panic disorder [episodic paroxysmal anxiety] without agoraphobia: Secondary | ICD-10-CM | POA: Diagnosis not present

## 2022-02-18 DIAGNOSIS — G4733 Obstructive sleep apnea (adult) (pediatric): Secondary | ICD-10-CM | POA: Diagnosis not present

## 2022-03-16 DIAGNOSIS — F4320 Adjustment disorder, unspecified: Secondary | ICD-10-CM | POA: Diagnosis not present

## 2022-03-30 DIAGNOSIS — F410 Panic disorder [episodic paroxysmal anxiety] without agoraphobia: Secondary | ICD-10-CM | POA: Diagnosis not present

## 2022-03-30 DIAGNOSIS — G4733 Obstructive sleep apnea (adult) (pediatric): Secondary | ICD-10-CM | POA: Diagnosis not present

## 2022-03-30 DIAGNOSIS — R030 Elevated blood-pressure reading, without diagnosis of hypertension: Secondary | ICD-10-CM | POA: Diagnosis not present

## 2022-03-30 DIAGNOSIS — F411 Generalized anxiety disorder: Secondary | ICD-10-CM | POA: Diagnosis not present

## 2022-05-18 DIAGNOSIS — Z0000 Encounter for general adult medical examination without abnormal findings: Secondary | ICD-10-CM | POA: Diagnosis not present

## 2022-05-18 DIAGNOSIS — Z23 Encounter for immunization: Secondary | ICD-10-CM | POA: Diagnosis not present

## 2022-05-23 DIAGNOSIS — Z01419 Encounter for gynecological examination (general) (routine) without abnormal findings: Secondary | ICD-10-CM | POA: Diagnosis not present

## 2022-05-23 DIAGNOSIS — Z6829 Body mass index (BMI) 29.0-29.9, adult: Secondary | ICD-10-CM | POA: Diagnosis not present

## 2022-05-23 DIAGNOSIS — Z1231 Encounter for screening mammogram for malignant neoplasm of breast: Secondary | ICD-10-CM | POA: Diagnosis not present

## 2022-05-31 ENCOUNTER — Encounter: Payer: Self-pay | Admitting: Internal Medicine

## 2022-06-17 DIAGNOSIS — G4733 Obstructive sleep apnea (adult) (pediatric): Secondary | ICD-10-CM | POA: Diagnosis not present

## 2022-06-26 DIAGNOSIS — Z79899 Other long term (current) drug therapy: Secondary | ICD-10-CM | POA: Diagnosis not present

## 2022-06-26 DIAGNOSIS — F410 Panic disorder [episodic paroxysmal anxiety] without agoraphobia: Secondary | ICD-10-CM | POA: Diagnosis not present

## 2022-06-26 DIAGNOSIS — G4733 Obstructive sleep apnea (adult) (pediatric): Secondary | ICD-10-CM | POA: Diagnosis not present

## 2022-06-26 DIAGNOSIS — F411 Generalized anxiety disorder: Secondary | ICD-10-CM | POA: Diagnosis not present

## 2022-06-28 DIAGNOSIS — D225 Melanocytic nevi of trunk: Secondary | ICD-10-CM | POA: Diagnosis not present

## 2022-06-28 DIAGNOSIS — D2261 Melanocytic nevi of right upper limb, including shoulder: Secondary | ICD-10-CM | POA: Diagnosis not present

## 2022-06-28 DIAGNOSIS — D2262 Melanocytic nevi of left upper limb, including shoulder: Secondary | ICD-10-CM | POA: Diagnosis not present

## 2022-06-28 DIAGNOSIS — L858 Other specified epidermal thickening: Secondary | ICD-10-CM | POA: Diagnosis not present

## 2022-07-18 DIAGNOSIS — G4733 Obstructive sleep apnea (adult) (pediatric): Secondary | ICD-10-CM | POA: Diagnosis not present

## 2022-08-04 DIAGNOSIS — I10 Essential (primary) hypertension: Secondary | ICD-10-CM | POA: Diagnosis not present

## 2022-08-04 DIAGNOSIS — Z79899 Other long term (current) drug therapy: Secondary | ICD-10-CM | POA: Diagnosis not present

## 2022-08-04 DIAGNOSIS — E785 Hyperlipidemia, unspecified: Secondary | ICD-10-CM | POA: Diagnosis not present

## 2022-08-18 DIAGNOSIS — G4733 Obstructive sleep apnea (adult) (pediatric): Secondary | ICD-10-CM | POA: Diagnosis not present

## 2022-09-26 DIAGNOSIS — F411 Generalized anxiety disorder: Secondary | ICD-10-CM | POA: Diagnosis not present

## 2022-09-26 DIAGNOSIS — F410 Panic disorder [episodic paroxysmal anxiety] without agoraphobia: Secondary | ICD-10-CM | POA: Diagnosis not present

## 2022-09-26 DIAGNOSIS — I10 Essential (primary) hypertension: Secondary | ICD-10-CM | POA: Diagnosis not present

## 2022-09-26 DIAGNOSIS — E785 Hyperlipidemia, unspecified: Secondary | ICD-10-CM | POA: Diagnosis not present

## 2022-10-05 DIAGNOSIS — G4733 Obstructive sleep apnea (adult) (pediatric): Secondary | ICD-10-CM | POA: Diagnosis not present

## 2022-11-05 DIAGNOSIS — G4733 Obstructive sleep apnea (adult) (pediatric): Secondary | ICD-10-CM | POA: Diagnosis not present

## 2022-12-05 DIAGNOSIS — G4733 Obstructive sleep apnea (adult) (pediatric): Secondary | ICD-10-CM | POA: Diagnosis not present

## 2023-01-22 DIAGNOSIS — Z713 Dietary counseling and surveillance: Secondary | ICD-10-CM | POA: Diagnosis not present

## 2023-02-07 DIAGNOSIS — G4733 Obstructive sleep apnea (adult) (pediatric): Secondary | ICD-10-CM | POA: Diagnosis not present

## 2023-02-13 DIAGNOSIS — Z713 Dietary counseling and surveillance: Secondary | ICD-10-CM | POA: Diagnosis not present

## 2023-03-09 DIAGNOSIS — Z713 Dietary counseling and surveillance: Secondary | ICD-10-CM | POA: Diagnosis not present

## 2023-03-10 DIAGNOSIS — G4733 Obstructive sleep apnea (adult) (pediatric): Secondary | ICD-10-CM | POA: Diagnosis not present

## 2023-03-27 DIAGNOSIS — E785 Hyperlipidemia, unspecified: Secondary | ICD-10-CM | POA: Diagnosis not present

## 2023-03-27 DIAGNOSIS — E559 Vitamin D deficiency, unspecified: Secondary | ICD-10-CM | POA: Diagnosis not present

## 2023-03-27 DIAGNOSIS — Z131 Encounter for screening for diabetes mellitus: Secondary | ICD-10-CM | POA: Diagnosis not present

## 2023-03-27 DIAGNOSIS — G4733 Obstructive sleep apnea (adult) (pediatric): Secondary | ICD-10-CM | POA: Diagnosis not present

## 2023-03-27 DIAGNOSIS — F411 Generalized anxiety disorder: Secondary | ICD-10-CM | POA: Diagnosis not present

## 2023-03-27 DIAGNOSIS — Z23 Encounter for immunization: Secondary | ICD-10-CM | POA: Diagnosis not present

## 2023-04-05 DIAGNOSIS — Z713 Dietary counseling and surveillance: Secondary | ICD-10-CM | POA: Diagnosis not present

## 2023-04-10 DIAGNOSIS — G4733 Obstructive sleep apnea (adult) (pediatric): Secondary | ICD-10-CM | POA: Diagnosis not present

## 2023-04-13 ENCOUNTER — Ambulatory Visit: Payer: Federal, State, Local not specified - PPO | Admitting: Podiatry

## 2023-04-13 DIAGNOSIS — M7661 Achilles tendinitis, right leg: Secondary | ICD-10-CM

## 2023-04-13 DIAGNOSIS — M9261 Juvenile osteochondrosis of tarsus, right ankle: Secondary | ICD-10-CM | POA: Diagnosis not present

## 2023-04-13 MED ORDER — METHYLPREDNISOLONE 4 MG PO TBPK: ORAL_TABLET | ORAL | 0 refills | Status: DC

## 2023-04-13 MED ORDER — MELOXICAM 15 MG PO TABS: 15.0000 mg | ORAL_TABLET | Freq: Every day | ORAL | 0 refills | Status: AC

## 2023-04-13 NOTE — Patient Instructions (Signed)
Achilles Tendinitis  with Rehab Achilles tendinitis is a disorder of the Achilles tendon. The Achilles tendon connects the large calf muscles (Gastrocnemius and Soleus) to the heel bone (calcaneus). This tendon is sometimes called the heel cord. It is important for pushing-off and standing on your toes and is important for walking, running, or jumping. Tendinitis is often caused by overuse and repetitive microtrauma. SYMPTOMS  Pain, tenderness, swelling, warmth, and redness may occur over the Achilles tendon even at rest.  Pain with pushing off, or flexing or extending the ankle.  Pain that is worsened after or during activity. CAUSES   Overuse sometimes seen with rapid increase in exercise programs or in sports requiring running and jumping.  Poor physical conditioning (strength and flexibility or endurance).  Running sports, especially training running down hills.  Inadequate warm-up before practice or play or failure to stretch before participation.  Injury to the tendon. PREVENTION   Warm up and stretch before practice or competition.  Allow time for adequate rest and recovery between practices and competition.  Keep up conditioning.  Keep up ankle and leg flexibility.  Improve or keep muscle strength and endurance.  Improve cardiovascular fitness.  Use proper technique.  Use proper equipment (shoes, skates).  To help prevent recurrence, taping, protective strapping, or an adhesive bandage may be recommended for several weeks after healing is complete. PROGNOSIS   Recovery may take weeks to several months to heal.  Longer recovery is expected if symptoms have been prolonged.  Recovery is usually quicker if the inflammation is due to a direct blow as compared with overuse or sudden strain. RELATED COMPLICATIONS   Healing time will be prolonged if the condition is not correctly treated. The injury must be given plenty of time to heal.  Symptoms can reoccur if  activity is resumed too soon.  Untreated, tendinitis may increase the risk of tendon rupture requiring additional time for recovery and possibly surgery. TREATMENT   The first treatment consists of rest anti-inflammatory medication, and ice to relieve the pain.  Stretching and strengthening exercises after resolution of pain will likely help reduce the risk of recurrence. Referral to a physical therapist or athletic trainer for further evaluation and treatment may be helpful.  A walking boot or cast may be recommended to rest the Achilles tendon. This can help break the cycle of inflammation and microtrauma.  Arch supports (orthotics) may be prescribed or recommended by your caregiver as an adjunct to therapy and rest.  Surgery to remove the inflamed tendon lining or degenerated tendon tissue is rarely necessary and has shown less than predictable results. MEDICATION   Nonsteroidal anti-inflammatory medications, such as aspirin and ibuprofen, may be used for pain and inflammation relief. Do not take within 7 days before surgery. Take these as directed by your caregiver. Contact your caregiver immediately if any bleeding, stomach upset, or signs of allergic reaction occur. Other minor pain relievers, such as acetaminophen, may also be used.  Pain relievers may be prescribed as necessary by your caregiver. Do not take prescription pain medication for longer than 4 to 7 days. Use only as directed and only as much as you need. HEAT AND COLD  Cold is used to relieve pain and reduce inflammation for acute and chronic Achilles tendinitis. Cold should be applied for 10 to 15 minutes every 2 to 3 hours for inflammation and pain and immediately after any activity that aggravates your symptoms. Use ice packs or an ice massage.  Heat may be used   before performing stretching and strengthening activities prescribed by your caregiver. Use a heat pack or a warm soak. SEEK MEDICAL CARE IF:  Symptoms get  worse or do not improve in 2 weeks despite treatment.  New, unexplained symptoms develop. Drugs used in treatment may produce side effects.   EXERCISES-- hold each stretch for 30 seconds and repeat 10 times.  Complete each stretch 3 times per day.   RANGE OF MOTION (ROM) AND STRETCHING EXERCISES - Achilles Tendinitis  These exercises may help you when beginning to rehabilitate your injury. Your symptoms may resolve with or without further involvement from your physician, physical therapist or athletic trainer. While completing these exercises, remember:   Restoring tissue flexibility helps normal motion to return to the joints. This allows healthier, less painful movement and activity.  An effective stretch should be held for at least 30 seconds.  A stretch should never be painful. You should only feel a gentle lengthening or release in the stretched tissue. STRETCH  Gastroc, Standing   Place hands on wall.  Extend right / left leg, keeping the front knee somewhat bent.  Slightly point your toes inward on your back foot.  Keeping your right / left heel on the floor and your knee straight, shift your weight toward the wall, not allowing your back to arch.  You should feel a gentle stretch in the right / left calf. Hold this position for __________ seconds. Repeat __________ times. Complete this stretch __________ times per day. STRETCH  Soleus, Standing   Place hands on wall.  Extend right / left leg, keeping the other knee somewhat bent.  Slightly point your toes inward on your back foot.  Keep your right / left heel on the floor, bend your back knee, and slightly shift your weight over the back leg so that you feel a gentle stretch deep in your back calf.  Hold this position for __________ seconds. Repeat __________ times. Complete this stretch __________ times per day. STRETCH  Gastrocsoleus, Standing  Note: This exercise can place a lot of stress on your foot and ankle.  Please complete this exercise only if specifically instructed by your caregiver.   Place the ball of your right / left foot on a step, keeping your other foot firmly on the same step.  Hold on to the wall or a rail for balance.  Slowly lift your other foot, allowing your body weight to press your heel down over the edge of the step.  You should feel a stretch in your right / left calf.  Hold this position for __________ seconds.  Repeat this exercise with a slight bend in your knee. Repeat __________ times. Complete this stretch __________ times per day.  STRENGTHENING EXERCISES - Achilles Tendinitis These exercises may help you when beginning to rehabilitate your injury. They may resolve your symptoms with or without further involvement from your physician, physical therapist or athletic trainer. While completing these exercises, remember:   Muscles can gain both the endurance and the strength needed for everyday activities through controlled exercises.  Complete these exercises as instructed by your physician, physical therapist or athletic trainer. Progress the resistance and repetitions only as guided.  You may experience muscle soreness or fatigue, but the pain or discomfort you are trying to eliminate should never worsen during these exercises. If this pain does worsen, stop and make certain you are following the directions exactly. If the pain is still present after adjustments, discontinue the exercise until you can discuss   the trouble with your clinician. STRENGTH - Plantar-flexors   Sit with your right / left leg extended. Holding onto both ends of a rubber exercise band/tubing, loop it around the ball of your foot. Keep a slight tension in the band.  Slowly push your toes away from you, pointing them downward.  Hold this position for __________ seconds. Return slowly, controlling the tension in the band/tubing. Repeat __________ times. Complete this exercise __________ times  per day.  STRENGTH - Plantar-flexors   Stand with your feet shoulder width apart. Steady yourself with a wall or table using as little support as needed.  Keeping your weight evenly spread over the width of your feet, rise up on your toes.*  Hold this position for __________ seconds. Repeat __________ times. Complete this exercise __________ times per day.  *If this is too easy, shift your weight toward your right / left leg until you feel challenged. Ultimately, you may be asked to do this exercise with your right / left foot only. STRENGTH  Plantar-flexors, Eccentric  Note: This exercise can place a lot of stress on your foot and ankle. Please complete this exercise only if specifically instructed by your caregiver.   Place the balls of your feet on a step. With your hands, use only enough support from a wall or rail to keep your balance.  Keep your knees straight and rise up on your toes.  Slowly shift your weight entirely to your right / left toes and pick up your opposite foot. Gently and with controlled movement, lower your weight through your right / left foot so that your heel drops below the level of the step. You will feel a slight stretch in the back of your calf at the end position.  Use the healthy leg to help rise up onto the balls of both feet, then lower weight only on the right / left leg again. Build up to 15 repetitions. Then progress to 3 consecutive sets of 15 repetitions.*  After completing the above exercise, complete the same exercise with a slight knee bend (about 30 degrees). Again, build up to 15 repetitions. Then progress to 3 consecutive sets of 15 repetitions.* Perform this exercise __________ times per day.  *When you easily complete 3 sets of 15, your physician, physical therapist or athletic trainer may advise you to add resistance by wearing a backpack filled with additional weight. STRENGTH - Plantar Flexors, Seated   Sit on a chair that allows your feet  to rest flat on the ground. If necessary, sit at the edge of the chair.  Keeping your toes firmly on the ground, lift your right / left heel as far as you can without increasing any discomfort in your ankle. Repeat __________ times. Complete this exercise __________ times a day. *If instructed by your physician, physical therapist or athletic trainer, you may add ____________________ of resistance by placing a weighted object on your right / left knee. Document Released: 02/01/2005 Document Revised: 09/25/2011 Document Reviewed: 10/15/2008 [employer] Patient Information 2014 [employer].    

## 2023-04-13 NOTE — Progress Notes (Signed)
Here Subjective:  Patient ID: Samantha Schmidt, female    DOB: 24-Aug-1980,  MRN: [id number]  Chief Complaint  Patient presents with   Foot Pain    RIGHT HEEL PAIN THAT TRAVELS UP BACK OF THE LEG, GETS WORSE WITH EXERCISE, BEEN GOING ON FOR 2-3 MONTHS, BEEN ICING IT AND WEARING COMPRESSION SOCKS, TRYING TO STAY OFF OF IT, KEEPING IT ELEVATED, LIKES TO BE ACTIVE.     42 y.o. female presents for concern for right heel pain she has pain in the back of the heel where there is a bump.  Has been going on for 2 to 3 months seems to be getting worse.  She has been icing it and taking ibuprofen.  Does help a little bit but still has a lot of pain in that area with any pressure on the bone bump in shoes.  Radiates up the Achilles and seems to be worse with going up and down stairs and she has reduced range of motion in the right ankle which she thinks.  Past Medical History:  Diagnosis Date   AMA (advanced maternal age) multigravida 35+    Elevated ALT measurement 05/16/2019   GERD (gastroesophageal reflux disease)    High risk HPV infection 05/20/2019   Hyperlipidemia    Rosacea    Sleep apnea    Sleep apnea    Vaginal Pap smear, abnormal    High Risk HPV    No Known Allergies  ROS: Negative except as per HPI above  Objective:  General: AAO x3, NAD  Dermatological: With inspection and palpation of the right and left lower extremities there are no open sores, no preulcerative lesions, no rash or signs of infection present. Nails are of normal length thickness and coloration.   Vascular:  Dorsalis Pedis artery and Posterior Tibial artery pedal pulses are 2/4 bilateral.  Capillary fill time < 3 sec to all digits.   Neruologic: Grossly intact via light touch bilateral. Protective threshold intact to all sites bilateral.   Musculoskeletal: Osseous prominence of the posterior aspect of the right calcaneus.  Pain with palpation of the posterior aspect of the heel.  Decreased ankle dorsiflexion  range of motion and pain is increased with ankle dorsiflexion range of motion.  Gait: Unassisted, Nonantalgic.   No images are attached to the encounter.  Radiographs:  Deferred Assessment:   1. Haglund's deformity of right heel   2. Achilles tendinitis of right lower extremity      Plan:  Patient was evaluated and treated and all questions answered.  # Haglund's deformity of right foot with Achilles tendinitis -Discussed conservative and surgical treatment options in detail with the patient -For now we will proceed with conservative measures including anti-inflammatories stretching icing and heel lifts -E Rx for methylprednisone 4 mg steroid taper pack extracted for 6 days -E Rx for meloxicam 15 mg take once daily for next 30 days -Discussed risk and side effects associate with medication therapy -Dispensed heel lifts to the patient -Provided stretching exercises for Achilles tendinitis -Patient will follow-up in 4 weeks and if no improvement will consider imaging and discussion of surgical correction which was briefly discussed at this appointment as well  Return in about 4 weeks (around 05/11/2023) for f/u achilles tendinitis/ haglunds deformity.          Corinna Gab, DPM [facility] / [facility]

## 2023-05-11 ENCOUNTER — Ambulatory Visit: Payer: Federal, State, Local not specified - PPO | Admitting: Podiatry

## 2023-05-11 DIAGNOSIS — Z713 Dietary counseling and surveillance: Secondary | ICD-10-CM | POA: Diagnosis not present

## 2023-05-25 ENCOUNTER — Encounter: Payer: Self-pay | Admitting: Podiatry

## 2023-05-25 ENCOUNTER — Ambulatory Visit: Payer: Federal, State, Local not specified - PPO | Admitting: Podiatry

## 2023-05-25 ENCOUNTER — Ambulatory Visit (INDEPENDENT_AMBULATORY_CARE_PROVIDER_SITE_OTHER): Payer: Federal, State, Local not specified - PPO

## 2023-05-25 DIAGNOSIS — M7661 Achilles tendinitis, right leg: Secondary | ICD-10-CM | POA: Diagnosis not present

## 2023-05-25 DIAGNOSIS — M9261 Juvenile osteochondrosis of tarsus, right ankle: Secondary | ICD-10-CM

## 2023-05-25 NOTE — Progress Notes (Signed)
Here Subjective:  Patient ID: Samantha Schmidt, female    DOB: 1980/12/09,  MRN: [id number]  Chief Complaint  Patient presents with   Foot Pain    Follow up achilles tendonitis/haglund's deformity right   "Its a lot better than what it was. That steroid pack has really helped"    42 y.o. female presents for follow-up on right Achilles tendinitis.  She was placed on a steroid pack at last visit to about 1 week of meloxicam daily.  She says she is feeling much better does not have much of any pain at this time.  She is also doing some stretching exercises.  Wearing good shoes with large heel on them and heel lifts.  Past Medical History:  Diagnosis Date   AMA (advanced maternal age) multigravida 35+    Elevated ALT measurement 05/16/2019   GERD (gastroesophageal reflux disease)    High risk HPV infection 05/20/2019   Hyperlipidemia    Rosacea    Sleep apnea    Sleep apnea    Vaginal Pap smear, abnormal    High Risk HPV    No Known Allergies  ROS: Negative except as per HPI above  Objective:  General: AAO x3, NAD  Dermatological: With inspection and palpation of the right and left lower extremities there are no open sores, no preulcerative lesions, no rash or signs of infection present. Nails are of normal length thickness and coloration.   Vascular:  Dorsalis Pedis artery and Posterior Tibial artery pedal pulses are 2/4 bilateral.  Capillary fill time < 3 sec to all digits.   Neruologic: Grossly intact via light touch bilateral. Protective threshold intact to all sites bilateral.   Musculoskeletal: No significant pain with palpation of the posterior aspect of the right heel or along the insertion area of the Achilles.  Decreased pain with dorsiflexion range of motion.  Gait: Unassisted, Nonantalgic.   No images are attached to the encounter.  Radiographs:  XR 3 views AP and lateral  right foot weightbearing 05/25/2023.  Findings: No significant osseous spurring at the  posterior aspect of the right calcaneus no significant Haglund's deformity noted no calcification of the Achilles. Assessment:   1. Haglund's deformity of right heel   2. Achilles tendinitis of right lower extremity      Plan:  Patient was evaluated and treated and all questions answered.  # Haglund's deformity of right foot with Achilles tendinitis -Overall improved from prior after steroid therapy -Continue conservative therapies including icing rest elevation anti-inflammatories as needed -If a flareup occurs we will consider another steroid pack -Continue heel lifts and good supportive shoes -Continue stretching exercises for Achilles tendinitis -Follow-up as needed        Corinna Gab, DPM [facility] / [facility]

## 2023-06-08 DIAGNOSIS — Z713 Dietary counseling and surveillance: Secondary | ICD-10-CM | POA: Diagnosis not present

## 2023-06-12 DIAGNOSIS — Z01419 Encounter for gynecological examination (general) (routine) without abnormal findings: Secondary | ICD-10-CM | POA: Diagnosis not present

## 2023-06-12 DIAGNOSIS — Z113 Encounter for screening for infections with a predominantly sexual mode of transmission: Secondary | ICD-10-CM | POA: Diagnosis not present

## 2023-06-12 DIAGNOSIS — Z1231 Encounter for screening mammogram for malignant neoplasm of breast: Secondary | ICD-10-CM | POA: Diagnosis not present

## 2023-06-12 DIAGNOSIS — Z124 Encounter for screening for malignant neoplasm of cervix: Secondary | ICD-10-CM | POA: Diagnosis not present

## 2023-06-12 DIAGNOSIS — Z1331 Encounter for screening for depression: Secondary | ICD-10-CM | POA: Diagnosis not present

## 2023-06-12 DIAGNOSIS — Z01411 Encounter for gynecological examination (general) (routine) with abnormal findings: Secondary | ICD-10-CM | POA: Diagnosis not present

## 2023-07-06 DIAGNOSIS — Z713 Dietary counseling and surveillance: Secondary | ICD-10-CM | POA: Diagnosis not present

## 2023-07-31 DIAGNOSIS — Z713 Dietary counseling and surveillance: Secondary | ICD-10-CM | POA: Diagnosis not present

## 2023-11-22 DIAGNOSIS — G4733 Obstructive sleep apnea (adult) (pediatric): Secondary | ICD-10-CM | POA: Diagnosis not present

## 2023-12-23 DIAGNOSIS — G4733 Obstructive sleep apnea (adult) (pediatric): Secondary | ICD-10-CM | POA: Diagnosis not present

## 2024-01-31 DIAGNOSIS — L918 Other hypertrophic disorders of the skin: Secondary | ICD-10-CM | POA: Diagnosis not present

## 2024-01-31 DIAGNOSIS — L821 Other seborrheic keratosis: Secondary | ICD-10-CM | POA: Diagnosis not present
